# Patient Record
Sex: Male | Born: 1955 | Race: White | Hispanic: No | Smoking: Current every day smoker
Health system: Southern US, Community
[De-identification: ages and names within clinical notes are randomized; demographics above are authoritative.]

## PROBLEM LIST (undated history)

## (undated) DIAGNOSIS — I639 Cerebral infarction, unspecified: Secondary | ICD-10-CM

## (undated) DIAGNOSIS — J189 Pneumonia, unspecified organism: Secondary | ICD-10-CM

## (undated) DIAGNOSIS — G8929 Other chronic pain: Secondary | ICD-10-CM

## (undated) DIAGNOSIS — F329 Major depressive disorder, single episode, unspecified: Secondary | ICD-10-CM

## (undated) DIAGNOSIS — M545 Low back pain, unspecified: Secondary | ICD-10-CM

## (undated) DIAGNOSIS — F419 Anxiety disorder, unspecified: Secondary | ICD-10-CM

## (undated) DIAGNOSIS — F32A Depression, unspecified: Secondary | ICD-10-CM

## (undated) DIAGNOSIS — R51 Headache: Secondary | ICD-10-CM

## (undated) DIAGNOSIS — S3992XA Unspecified injury of lower back, initial encounter: Secondary | ICD-10-CM

## (undated) DIAGNOSIS — C4490 Unspecified malignant neoplasm of skin, unspecified: Secondary | ICD-10-CM

## (undated) DIAGNOSIS — M1990 Unspecified osteoarthritis, unspecified site: Secondary | ICD-10-CM

## (undated) DIAGNOSIS — R519 Headache, unspecified: Secondary | ICD-10-CM

## (undated) HISTORY — PX: NASAL RECONSTRUCTION WITH SEPTAL REPAIR: SHX5665

## (undated) HISTORY — PX: SKIN CANCER EXCISION: SHX779

---

## 1998-09-15 ENCOUNTER — Emergency Department (HOSPITAL_COMMUNITY): Admission: EM | Admit: 1998-09-15 | Discharge: 1998-09-15 | Payer: Self-pay

## 1998-09-19 ENCOUNTER — Encounter: Payer: Self-pay | Admitting: Emergency Medicine

## 1998-09-19 ENCOUNTER — Emergency Department (HOSPITAL_COMMUNITY): Admission: EM | Admit: 1998-09-19 | Discharge: 1998-09-19 | Payer: Self-pay | Admitting: Emergency Medicine

## 2000-05-26 ENCOUNTER — Ambulatory Visit (HOSPITAL_BASED_OUTPATIENT_CLINIC_OR_DEPARTMENT_OTHER): Admission: RE | Admit: 2000-05-26 | Discharge: 2000-05-26 | Payer: Self-pay | Admitting: Plastic Surgery

## 2001-12-14 ENCOUNTER — Encounter: Payer: Self-pay | Admitting: Emergency Medicine

## 2001-12-14 ENCOUNTER — Inpatient Hospital Stay (HOSPITAL_COMMUNITY): Admission: EM | Admit: 2001-12-14 | Discharge: 2001-12-15 | Payer: Self-pay | Admitting: *Deleted

## 2001-12-21 ENCOUNTER — Encounter: Admission: RE | Admit: 2001-12-21 | Discharge: 2001-12-21 | Payer: Self-pay | Admitting: Family Medicine

## 2003-01-21 DIAGNOSIS — I639 Cerebral infarction, unspecified: Secondary | ICD-10-CM

## 2003-01-21 HISTORY — DX: Cerebral infarction, unspecified: I63.9

## 2004-11-21 ENCOUNTER — Ambulatory Visit: Payer: Self-pay | Admitting: Cardiology

## 2004-11-21 ENCOUNTER — Observation Stay (HOSPITAL_COMMUNITY): Admission: EM | Admit: 2004-11-21 | Discharge: 2004-11-22 | Payer: Self-pay | Admitting: Emergency Medicine

## 2004-11-26 ENCOUNTER — Ambulatory Visit: Payer: Self-pay

## 2004-12-04 ENCOUNTER — Ambulatory Visit: Payer: Self-pay | Admitting: Cardiology

## 2004-12-06 ENCOUNTER — Inpatient Hospital Stay (HOSPITAL_BASED_OUTPATIENT_CLINIC_OR_DEPARTMENT_OTHER): Admission: RE | Admit: 2004-12-06 | Discharge: 2004-12-06 | Payer: Self-pay | Admitting: Cardiology

## 2004-12-06 HISTORY — PX: CARDIAC CATHETERIZATION: SHX172

## 2004-12-18 ENCOUNTER — Ambulatory Visit: Payer: Self-pay | Admitting: Cardiology

## 2005-09-01 ENCOUNTER — Ambulatory Visit: Payer: Self-pay | Admitting: Family Medicine

## 2005-10-02 ENCOUNTER — Ambulatory Visit: Payer: Self-pay | Admitting: Family Medicine

## 2006-01-16 ENCOUNTER — Encounter: Admission: RE | Admit: 2006-01-16 | Discharge: 2006-01-16 | Payer: Self-pay | Admitting: Family Medicine

## 2006-01-16 ENCOUNTER — Ambulatory Visit: Payer: Self-pay | Admitting: Family Medicine

## 2007-05-18 ENCOUNTER — Ambulatory Visit: Payer: Self-pay | Admitting: Family Medicine

## 2007-05-18 ENCOUNTER — Encounter: Admission: RE | Admit: 2007-05-18 | Discharge: 2007-05-18 | Payer: Self-pay | Admitting: Family Medicine

## 2008-06-28 ENCOUNTER — Emergency Department (HOSPITAL_COMMUNITY): Admission: EM | Admit: 2008-06-28 | Discharge: 2008-06-28 | Payer: Self-pay | Admitting: Emergency Medicine

## 2009-07-04 ENCOUNTER — Emergency Department (HOSPITAL_COMMUNITY): Admission: EM | Admit: 2009-07-04 | Discharge: 2009-07-04 | Payer: Self-pay | Admitting: Emergency Medicine

## 2009-11-18 ENCOUNTER — Ambulatory Visit: Payer: Self-pay | Admitting: Cardiology

## 2009-11-18 ENCOUNTER — Observation Stay (HOSPITAL_COMMUNITY): Admission: EM | Admit: 2009-11-18 | Discharge: 2009-11-19 | Payer: Self-pay | Admitting: Emergency Medicine

## 2009-11-19 ENCOUNTER — Encounter (INDEPENDENT_AMBULATORY_CARE_PROVIDER_SITE_OTHER): Payer: Self-pay | Admitting: Internal Medicine

## 2010-04-03 LAB — HEMOGLOBIN A1C
Hgb A1c MFr Bld: 5.7 % — ABNORMAL HIGH (ref <5.7)
Mean Plasma Glucose: 117 mg/dL — ABNORMAL HIGH (ref <117)

## 2010-04-03 LAB — POCT CARDIAC MARKERS
CKMB, poc: <1 ng/mL — ABNORMAL LOW (ref 1.0–8.0)
Myoglobin, poc: 40.9 ng/mL (ref 12–200)
Troponin i, poc: <0.05 ng/mL (ref 0.00–0.09)

## 2010-04-03 LAB — POCT I-STAT, CHEM 8
BUN: 10 mg/dL (ref 6–23)
Chloride: 106 mEq/L (ref 96–112)
Creatinine, Ser: 0.8 mg/dL (ref 0.4–1.5)
Sodium: 139 mEq/L (ref 135–145)

## 2010-04-03 LAB — RAPID URINE DRUG SCREEN, HOSP PERFORMED: Barbiturates: NOT DETECTED

## 2010-04-03 LAB — LIPID PANEL
Cholesterol: 145 mg/dL (ref 0–200)
Triglycerides: 73 mg/dL (ref <150)

## 2010-04-03 LAB — CBC
HCT: 41.7 % (ref 39.0–52.0)
Platelets: 245 10*3/uL (ref 150–400)
RBC: 4.56 MIL/uL (ref 4.22–5.81)
RDW: 11.9 % (ref 11.5–15.5)
WBC: 8.1 10*3/uL (ref 4.0–10.5)

## 2010-04-03 LAB — DIFFERENTIAL
Basophils Absolute: 0 10*3/uL (ref 0.0–0.1)
Lymphocytes Relative: 19 % (ref 12–46)
Lymphs Abs: 1.6 10*3/uL (ref 0.7–4.0)
Neutro Abs: 5.4 10*3/uL (ref 1.7–7.7)

## 2010-04-03 LAB — CK TOTAL AND CKMB (NOT AT ARMC)
CK, MB: 0.7 ng/mL (ref 0.3–4.0)
Total CK: 71 U/L (ref 7–232)

## 2010-04-03 LAB — CARDIAC PANEL(CRET KIN+CKTOT+MB+TROPI)
CK, MB: 0.5 ng/mL (ref 0.3–4.0)
CK, MB: 0.6 ng/mL (ref 0.3–4.0)
Total CK: 61 U/L (ref 7–232)

## 2010-04-03 LAB — TROPONIN I: Troponin I: <0.01 ng/mL (ref 0.00–0.06)

## 2010-04-07 LAB — RAPID URINE DRUG SCREEN, HOSP PERFORMED
Benzodiazepines: NOT DETECTED
Cocaine: NOT DETECTED
Opiates: NOT DETECTED
Tetrahydrocannabinol: NOT DETECTED

## 2010-04-07 LAB — CBC
HCT: 43.1 % (ref 39.0–52.0)
MCV: 93.2 fL (ref 78.0–100.0)
Platelets: 272 10*3/uL (ref 150–400)
WBC: 7.1 10*3/uL (ref 4.0–10.5)

## 2010-04-07 LAB — DIFFERENTIAL
Basophils Absolute: 0 10*3/uL (ref 0.0–0.1)
Basophils Relative: 1 % (ref 0–1)
Eosinophils Absolute: 0 10*3/uL (ref 0.0–0.7)
Eosinophils Relative: 1 % (ref 0–5)

## 2010-04-07 LAB — POCT CARDIAC MARKERS: CKMB, poc: <1 ng/mL — ABNORMAL LOW (ref 1.0–8.0)

## 2010-04-07 LAB — GLUCOSE, CAPILLARY

## 2010-04-07 LAB — TROPONIN I: Troponin I: 0.03 ng/mL (ref 0.00–0.06)

## 2010-04-07 LAB — COMPREHENSIVE METABOLIC PANEL
ALT: 15 U/L (ref 0–53)
AST: 19 U/L (ref 0–37)
CO2: 27 mEq/L (ref 19–32)
Chloride: 104 mEq/L (ref 96–112)
Creatinine, Ser: 0.96 mg/dL (ref 0.4–1.5)
GFR calc Af Amer: >60 mL/min (ref >60)
GFR calc non Af Amer: >60 mL/min (ref >60)
Sodium: 138 mEq/L (ref 135–145)
Total Bilirubin: 0.3 mg/dL (ref 0.3–1.2)

## 2010-04-07 LAB — PROTIME-INR: Prothrombin Time: 12.2 seconds (ref 11.6–15.2)

## 2010-04-29 LAB — URINALYSIS, ROUTINE W REFLEX MICROSCOPIC
Bilirubin Urine: NEGATIVE
Glucose, UA: NEGATIVE mg/dL
Ketones, ur: NEGATIVE mg/dL
Nitrite: NEGATIVE
Protein, ur: NEGATIVE mg/dL
pH: 6 (ref 5.0–8.0)

## 2010-04-29 LAB — DIFFERENTIAL
Basophils Relative: 1 % (ref 0–1)
Eosinophils Absolute: 0.1 10*3/uL (ref 0.0–0.7)
Eosinophils Relative: 1 % (ref 0–5)
Monocytes Absolute: 0.7 10*3/uL (ref 0.1–1.0)
Monocytes Relative: 9 % (ref 3–12)

## 2010-04-29 LAB — CBC
Platelets: 278 10*3/uL (ref 150–400)
RDW: 12.1 % (ref 11.5–15.5)
WBC: 7.3 10*3/uL (ref 4.0–10.5)

## 2010-04-29 LAB — RAPID URINE DRUG SCREEN, HOSP PERFORMED
Benzodiazepines: NOT DETECTED
Cocaine: NOT DETECTED
Tetrahydrocannabinol: POSITIVE — AB

## 2010-04-29 LAB — COMPREHENSIVE METABOLIC PANEL
ALT: 13 U/L (ref 0–53)
AST: 18 U/L (ref 0–37)
Albumin: 4.2 g/dL (ref 3.5–5.2)
Alkaline Phosphatase: 57 U/L (ref 39–117)
Potassium: 3.8 mEq/L (ref 3.5–5.1)
Sodium: 138 mEq/L (ref 135–145)
Total Protein: 7.1 g/dL (ref 6.0–8.3)

## 2010-04-29 LAB — ETHANOL: Alcohol, Ethyl (B): <5 mg/dL (ref 0–10)

## 2010-06-07 NOTE — Cardiovascular Report (Signed)
NAME:  Ruben Schmidt, KUCHER NO.:  [id number]   MEDICAL RECORD NO.:  [id number]          PATIENT TYPE:  OIB   LOCATION:  1961                         FACILITY:  [facility]   PHYSICIAN:  Charlies Constable, M.D. 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  He has hypertension and a history of remote smoking.  He was hospitalized with chest pain and subsequently had a Myoview scan  which showed positive chest pain and positive ECG changes but no abnormality  on the Myoview scan.  It was felt that he might have microvascular angina.  Because of persistent symptoms, he was brought in for evaluation with  catheterization.   PROCEDURE:  The procedure was performed via the right femoral artery using  arterial sheath and 4 French preformed coronary catheters.  A femoral  arterial puncture was performed and Omnipaque contrast was used.  The  patient tolerated the procedure well and left the laboratory in satisfactory  condition.   RESULTS:  Left main coronary artery was free of significant disease.   Left anterior descending artery gave rise to a diagonal branch and three  septal perforators.  These and the LAD proper were free of significant  disease.   Circumflex artery gave rise to a ramus branch, an atrial branch, a marginal  branch, and two posterolateral branches.  These vessels were free of  significant disease.   The right coronary artery is a moderately large vessel and gave rise to a  clonus branch, a right ventricular branch, and a a posterior descending  branch.  These vessels were free of significant disease.   The left ventriculogram performed in the RAO projection showed good wall  motion with no areas of hypokinesis.  The estimated ejection fraction was  60%.   HEMODYNAMIC DATA:  The aortic pressure was 110/73 with a mean of 91.  The  left ventricular pressure is 110/13.   CONCLUSION:  Normal coronary angiography and left ventricular wall motion.   RECOMMENDATIONS:  The etiology of the patient's chest pain is not clear.  Since he did have pain with exertion and ST segment with exertion, he may  have microvascular angina.  We will plan follow up with Dr. Daleen Squibb who will  make a decision regarding further therapy.           ______________________________  Charlies Constable, M.D. [facility]     BB/MEDQ  D:  12/06/2004  T:  12/06/2004  Job:  [id number]   cc:   Thomas C. Wall, M.D.  [address]  [address]  [place]  [place] [address]   Sherin Quarry, MD

## 2010-06-07 NOTE — Discharge Summary (Signed)
NAME:  CECILIO, OHLRICH NO.:  [id number]   MEDICAL RECORD NO.:  [id number]                   PATIENT TYPE:  INP   LOCATION:  5002                                 FACILITY:  [facility]   PHYSICIAN:  Leighton Roach McDiarmid, M.D.             DATE OF BIRTH:  10/24/1955   DATE OF ADMISSION:  12/14/2001  DATE OF DISCHARGE:  12/15/2001                                 DISCHARGE SUMMARY   DISCHARGE DIAGNOSES:  1. Viral upper respiratory infection.  2. Gastroesophageal reflux disease.   PROCEDURES:  None.   CONSULTATIONS:  None.   LABORATORY DATA:  1. Portable chest x-ray showed mild pulmonary vascular congestion without     edema.  2. CBC showed a white blood cell count of 7.6, hemoglobin 13.1, hematocrit     36.8, platelet count 253.  Differential showed 87% neutrophils, 6%     lymphocytes, 7% monocytes.  3. A complete metabolic panel showed a sodium of 135, potassium 3.5.     chloride 107, CO2 of 23, glucose 100, BUN 12, creatinine 0.9, total     bilirubin 0.6, alkaline phosphatase 63, SGOT 16, SGPT 26, total protein     6.5, albumin 3.8, calcium 8.7.  4. D-dimer was normal at less than 0.22.  5. Cardiac enzymes x 2 were checked and were both negative.  6. Urinalysis was completely normal.  7. EKG showed sinus tachycardia with a heart rate of 102.  There was     nonspecific T wave abnormality, inverted T waves in III and aVF.  T wave     was flat in V1 as well as V5 and V6.  No acute ischemic changes were     seen.   HOSPITAL COURSE:  1. VIRAL UPPER RESPIRATORY INFECTION:  This is a 56 year old white male who     presented to [facility] Emergency Department via EMS after having been     woken up with substernal chest pain rated at 5/5.  The pain was constant.     It radiated down his left arm causing some numbness.  It was unrelated to     activity.  However, he was also having some symptoms consistent with an     upper respiratory infection including  chills, fever, muscle aches,     nausea, and joint aches.  He also had sick contacts at home with three     children with the flu.  He also described some shortness of breath and     some dry heaving.  He was, therefore, admitted to further work up his     chest pain.  He was ruled out for myocardial infarction.  During his     observation, his symptoms improved on their own.  At discharge, he had no     further complaints of chest pain.  He had no shortness of breath.  His     vital signs  remained stable throughout his admission.  At discharge, his     temperature was 97.7, pulse 108, respiratory rate 20, blood pressure     122/63.  His oxygen saturation was 97% on room air.  He tolerated p.o.     without difficulty.  He was, therefore, sent home with symptomatic care     using Tylenol and Motrin as needed.   1. GASTROESOPHAGEAL REFLUX DISEASE:  The patient was given Pepcid p.r.n. for     pain.  He may continue this as an outpatient.   DISCHARGE MEDICATIONS:  1. Tylenol 650 mg p.o. q.6h. p.r.n. for pain or fever.  2. Ibuprofen 600 mg p.o. q.6h. p.r.n. for pain or fever.  3. Over-the-counter Zantac as needed for reflux disease.   ACTIVITY:  No restrictions.   DIET:  No restrictions.   WOUND CARE:  Not applicable.   SPECIAL INSTRUCTIONS:  The patient is to come back to the hospital for  increasing shortness of breath, chest pain, nausea, vomiting, or diarrhea.   FOLLOW UP:  The patient may make an appointment with the [facility].  Otherwise, he may also choose another primary care  physician in his insurance network.     Emmit Alexanders, M.D.                            Etta Grandchild, M.D.    DV/MEDQ  D:  12/15/2001  T:  12/16/2001  Job:  [id number]

## 2010-06-07 NOTE — H&P (Signed)
NAME:  IVERSON, SEES NO.:  [id number]   MEDICAL RECORD NO.:  [id number]          PATIENT TYPE:  INP   LOCATION:  3707                         FACILITY:  [facility]   PHYSICIAN:  Sherin Quarry, MD      DATE OF BIRTH:  1955/02/23   DATE OF ADMISSION:  11/21/2004  DATE OF DISCHARGE:                                HISTORY & PHYSICAL   Ruben Schmidt Schmidt is a pleasant 55 year old man who reports that today he came  to work at about 6 o'clock in the morning. 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 He became  diaphoretic. He estimates that the sweating lasted for about 10 minutes. He  felt dyspneic during this time. He also noticed a sense of sudden numbness  in his left arm which seemed to radiate down to his hand. These symptoms  caused his coworkers to become concerned about him and EMS was summoned.  According to the EMS report, they arrived to Ruben Schmidt him about 8:45 and he was  administered a sublingual nitroglycerin. He states that the nitroglycerin  made him feel dizzy and gave him a headache. It really did not have any  impact on the chest symptoms. On arrival to the emergency room, possibly as  result of the nitroglycerin, his blood pressure was 100/60, pulse was 92,  respirations were 18 and regular. Electrocardiogram was obtained which  showed a normal sinus rhythm with no ischemic changes. Serial point of care  cardiac markers have been obtained and are negative.   Mr. Staton reports that he does not smoke. He gave this up in 2000. He does  not have any history of diabetes or hypertension. As far as he knows, there  is no family history of heart disease. He does not know whether his  cholesterol is abnormal. In light of these symptoms, it was felt prudent to  admit Mr.  Schmidt for observation and to rule out myocardial infarction.   MEDICATIONS:  None.   ALLERGIES:  None.   OPERATIONS:  He has had skin cancer excised from his left temple.   MEDICAL ILLNESSES:  1.  The patient recalls that he was hospitalized in 2003 after presenting      with what sounds like rather similar symptoms. A pain in the chest that      radiated down his left arm. These symptoms were ultimately attributed to      a viral upper respiratory infection. He did not at that time or      subsequently have a stress test. He does not recall having had any      similar episodes of chest pain between 2003 and the present.  2.  His wife reports he had a history of kidney stone in the past, although      he did not have a procedure done for this.  3.  The patient has a longstanding  history of epigastric pain with meals. He      will take a Zantac p.r.n. for this. This does not wake him up at night.      He has had no hematemesis or melena.   FAMILY HISTORY:  His mother has a history of breast cancer. He does not  think the either his mother or father has a history of high blood pressure,  heart disease or diabetes. He has an uncle who has diabetes. He has a  brother but does not really know anything about his health history.   SOCIAL HISTORY:  He states that he does not abuse alcohol, does not abuse  drugs. He does not smoke, having quit smoking in 2000.   REVIEW OF SYSTEMS:  HEAD:  He is currently experiencing a headache as result  of nitroglycerin but denies past history of headache, dizziness or visual  blurring. EAR, NOSE AND THROAT:  Denies earache, sinus pain or sore throat.  CHEST:  Denies coughing wheezing or chest congestion. CARDIOVASCULAR:  Ruben Schmidt  above. GI: Ruben Schmidt above. GU: Denies dysuria or urinary frequency. NEURO:  There  is no history of seizure or stroke. ENDOCRINE:  Denies excessive thirst,  urinary frequency or nocturia.   PHYSICAL EXAMINATION:  VITAL SIGNS:   Temperature is 98.6, blood pressure is  103/71, pulse 83, respirations 18, O2 saturation 98%.  HEENT: Exam is within normal limits.  CHEST:  Clear.  CARDIOVASCULAR:  Reveals normal S1-S2 without rubs, murmurs or gallops.  ABDOMEN:  Benign. Normal bowel sounds without masses or tenderness. There is  no guarding or rebound.  NEUROLOGIC:  Testing is within normal limits.  EXTREMITIES:  Examination extremities reveals excellent DP and PT pulses.  There is no cyanosis or edema.   IMPRESSION:  1.  Exertional chest pain with associated arm numbness, diaphoresis,      squeezing in quality as well as shortness of breath. Need to consider a      possible cardiac etiology of these symptoms.  2.  Chronic gastroesophageal reflux/gastritis.  3.  History of hospitalization for a viral illness in November 2003. Note      that this episode was described as chest pain.  4.  History of kidney stone.   PLAN:  Rule out MI following standard protocol. We will place him on  Lovenox, aspirin and beta blocker. Cardiology consult was requested with  [facility] for possible Cardiolite stress testing. We are going to  place him empirically on Protonix in light of his acid reflux symptoms.           ______________________________  Sherin Quarry, MD     SY/MEDQ  D:  11/21/2004  T:  11/22/2004  Job:  [id number]   cc:   Talmadge Coventry, M.D.  Fax: [phone]

## 2010-06-07 NOTE — Discharge Summary (Signed)
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  These have boxes weighed about four pounds each, he  said.  After doing this activity, he began to experience a sharp discomfort  in the left chest area associated with an intense squeezing sensation.  He  became diaphoretic and reports that these symptoms persisted for several  minutes.  He noted some shortness of breath and his coworkers became  concerned that he looked sick.  He also complained of numbness in his left  arm which seemed to radiate to the hand.  Although he did not want them to  do so, his coworkers called EMS and on arrival he was given one sublingual  nitroglycerin, which really did not have any effect on his symptoms.  On  arrival to the emergency room his blood pressure was 100/60, probably as  result of the nitroglycerin.  His electrocardiogram showed a normal sinus  rhythm.  His physical exam was unremarkable.  His chest was clear.  Cardiovascular exam revealed normal S1-S2 without rubs, murmurs or gallops.  Laboratory studies obtained included a normal CBC.  Serial point of care  cardiac markers were negative.  BMET was normal.  Subsequently troponin  levels have been 0.01.  A lipid profile was obtained, which showed a total  cholesterol of 224, triglycerides of 297, HDL of 38, LDL of 127.  The  patient was monitored on telemetry and remained in a sinus rhythm.  He was  seen in consultation by Dr. [place] Bing, who felt that his  numbness  symptoms might possibly be secondary to a cervical radiculopathy and that  his chest pain was atypical but that he did need to have a stress test.  He  felt that the stress test could safely be performed as an outpatient.  On  November 3 the patient was feeling fine.  Vital signs were stable.  He was  having no further chest pain or breathing difficulty.  It was therefore felt  reasonable to discharge him at that time.   DISCHARGE DIAGNOSES:  1.  Atypical chest pain, consider possible angina.  2.  Chronic epigastric pain probably secondary to gastritis or reflux      esophagitis.  3.  History of kidney stone.  4.  Transient left arm numbness, could be secondary to cervical      radiculopathy. `   DISCHARGE INSTRUCTIONS:  On discharge the patient was instructed to report  to [facility] cardiology on November 7 at 9:45 for a stress Myoview study.  In  the meantime he  was advised to absolutely refrain from any heavy lifting.  He was advised to take aspirin 325 mg daily and Protonix 40 mg daily.  I did  not initiate any treatment for his cholesterol as the patient was not very  clear about whether he would see anyone on a regular basis in follow-up.  I  had a long conversation with him in which I urged him to begin to see a  primary care doctor on a regular basis.  His wife sees Dr. Smith Mince, and I  suggested that her office would be a good one to establish primary  care at.  For this reason I am sending a copy of this dictation to Dr.  Stephannie Peters office.  Hopefully, he will follow through with this advice and  she can address his mildly abnormal cholesterol level.  [name] was  advised not to return to his job until the stress test was performed because  his job involves significant heavy lifting.           ______________________________  Sherin Quarry, MD     SY/MEDQ  D:  11/22/2004  T:  11/22/2004  Job:  [id number]   cc:   Severn Bing, M.D. [facility]  [address]   [address]  [place]  [place] [address]   Talmadge Coventry, M.D.  Fax: [phone]

## 2010-06-07 NOTE — Consult Note (Signed)
NAME:  Ruben Schmidt, Ruben Schmidt NO.:  [id number]   MEDICAL RECORD NO.:  [id number]          PATIENT TYPE:  INP   LOCATION:  3707                         FACILITY:  [facility]   PHYSICIAN:  [place] Bing, M.D. [facility] OF BIRTH:  09-29-1955   DATE OF CONSULTATION:  11/21/2004  DATE OF DISCHARGE:                                   CONSULTATION   REFERRING PHYSICIAN:  Dr. Tresa Endo.   HISTORY OF PRESENT ILLNESS:  A 55 year old gentleman presenting to the  emergency department with acute onset of chest discomfort. Ruben Schmidt has  no known cardiovascular history. He has had borderline hypertension that has  not required medical therapy. He has no diabetes. He has a history of remote  cigarette smoking. His lipid status is unknown. While lifting boxes at work  this morning, he had the sudden onset of sharp and moderately severe left  chest pain with radiation to the left shoulder and left arm and predominant  numbness in the left arm. There is no dyspnea, diaphoresis nor nausea. The  chest discomfort gradually faded, but numbness has persisted and extending  to the fifth finger.   Past medical history is notable for excision of lipoma from the patient's  back in 2003. He underwent remote sinus surgery. He has had GERD symptoms.  He was admitted to hospital in 2003 with chest discomfort and found to have  a URI.   He uses no medications routinely. He has no known allergies.   SOCIAL HISTORY:  Married and lives with his wife in [place]; works for  [employer] and [employer]. A 60-pack-year history of cigarette smoking,  discontinued 7 years ago. He does have moderate chronic consumption of  alcohol.   FAMILY HISTORY:  No first-degree relatives with coronary or vascular  disease. No diabetes or hypertension.   Review of systems is notable for the need for corrective lenses for reading,  suboptimal regular dental care and constant heartburn. He underwent excision  of skin cancer from  his right forehead in 2003. Other systems reviewed and  are negative.   EXAM:  GENERAL:  Pleasant, well-appearing gentleman.  VITAL SIGNS:  Temperature is 98.2, heart rate 90 and regular, respirations  18, blood pressure 100/60, O2 saturation 97% on 2 liters.  HEENT: Anicteric sclerae; normal lids and conjunctiva.  NECK: No jugular venous distension; normal carotid upstrokes without bruits.  ENDOCRINE: No thyromegaly.  HEMATOPOIETIC: No adenopathy.  SKIN: No significant lesions.  LUNGS: Clear.  CARDIAC: Normal first and second heart sounds; fourth heart sound present.  ABDOMEN: Soft and nontender; no organomegaly.  EXTREMITIES: No edema; distal pulses intact type.  NEUROLOGIC: Decreased pinprick sensation in the left hand over the C8  distribution.   Chest x-ray:  Apical bolus change; no acute abnormalities.  EKG: Normal sinus rhythm; minimal nonspecific ST-segment abnormalities,  particularly inferiorly.   IMPRESSION:  Ruben Schmidt symptoms are most suggestive of the cervical  radiculopathy. C-spine films have been requested. Due to the somewhat  atypical and fairly prominent component of chest discomfort, I agree with an  observation stay to rule out myocardial infarction. If tests remain  negative, as expected, we will arrange for an outpatient stress test. The  request for consultation is greatly appreciated.      [place] Bing, M.D. [facility]  Electronically Signed     RR/MEDQ  D:  11/21/2004  T:  11/22/2004  Job:  [id number]

## 2011-06-10 ENCOUNTER — Emergency Department (HOSPITAL_COMMUNITY): Payer: Medicare Other

## 2011-06-10 ENCOUNTER — Encounter (HOSPITAL_COMMUNITY): Payer: Self-pay

## 2011-06-10 ENCOUNTER — Emergency Department (HOSPITAL_COMMUNITY)
Admission: EM | Admit: 2011-06-10 | Discharge: 2011-06-10 | Disposition: A | Discharge status: Home / Self Care | Payer: Medicare Other | Attending: Emergency Medicine | Admitting: Emergency Medicine

## 2011-06-10 DIAGNOSIS — M545 Low back pain, unspecified: Secondary | ICD-10-CM | POA: Insufficient documentation

## 2011-06-10 DIAGNOSIS — M25551 Pain in right hip: Secondary | ICD-10-CM

## 2011-06-10 DIAGNOSIS — M25559 Pain in unspecified hip: Secondary | ICD-10-CM | POA: Insufficient documentation

## 2011-06-10 DIAGNOSIS — J45909 Unspecified asthma, uncomplicated: Secondary | ICD-10-CM | POA: Insufficient documentation

## 2011-06-10 DIAGNOSIS — F17200 Nicotine dependence, unspecified, uncomplicated: Secondary | ICD-10-CM | POA: Insufficient documentation

## 2011-06-10 HISTORY — DX: Unspecified osteoarthritis, unspecified site: M19.90

## 2011-06-10 HISTORY — DX: Cerebral infarction, unspecified: I63.9

## 2011-06-10 MED ORDER — HYDROCODONE-ACETAMINOPHEN 5-325 MG PO TABS: ORAL_TABLET | ORAL | Status: DC

## 2011-06-10 MED ORDER — HYDROCODONE-ACETAMINOPHEN 5-325 MG PO TABS
1.0000 | ORAL_TABLET | Freq: Once | ORAL | Status: AC
  Administered 2011-06-10: 1 via ORAL
  Filled 2011-06-10: qty 1

## 2011-06-10 MED ORDER — IBUPROFEN 800 MG PO TABS
800.0000 mg | ORAL_TABLET | Freq: Once | ORAL | Status: AC
  Administered 2011-06-10: 800 mg via ORAL
  Filled 2011-06-10: qty 1
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
## 2011-06-10 NOTE — ED Notes (Signed)
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
## 2011-06-10 NOTE — Discharge Instructions (Signed)
Cryotherapy Cryotherapy means treatment with cold. Ice or gel packs can be used to reduce both pain and swelling. Ice is the most helpful within the first 24 to 48 hours after an injury or flareup from overusing a muscle or joint. Sprains, strains, spasms, burning pain, shooting pain, and aches can all be eased with ice. Ice can also be used when recovering from surgery. Ice is effective, has very few side effects, and is safe for most people to use. PRECAUTIONS  Ice is not a safe treatment option for people with:  Raynaud's phenomenon. This is a condition affecting small blood vessels in the extremities. Exposure to cold may cause your problems to return.   Cold hypersensitivity. There are many forms of cold hypersensitivity, including:   Cold urticaria. Red, itchy hives appear on the skin when the tissues begin to warm after being iced.   Cold erythema. This is a red, itchy rash caused by exposure to cold.   Cold hemoglobinuria. Red blood cells break down when the tissues begin to warm after being iced. The hemoglobin that carry oxygen are passed into the urine because they cannot combine with blood proteins fast enough.   Numbness or altered sensitivity in the area being iced.  If you have any of the following conditions, do not use ice until you have discussed cryotherapy with your caregiver:  Heart conditions, such as arrhythmia, angina, or chronic heart disease.   High blood pressure.   Healing wounds or open skin in the area being iced.   Current infections.   Rheumatoid arthritis.   Poor circulation.   Diabetes.  Ice slows the blood flow in the region it is applied. This is beneficial when trying to stop inflamed tissues from spreading irritating chemicals to surrounding tissues. However, if you expose your skin to cold temperatures for too long or without the proper protection, you can damage your skin or nerves. Watch for signs of skin damage due to cold. HOME CARE  INSTRUCTIONS Follow these tips to use ice and cold packs safely.  Place a dry or damp towel between the ice and skin. A damp towel will cool the skin more quickly, so you may need to shorten the time that the ice is used.   For a more rapid response, add gentle compression to the ice.   Ice for no more than 10 to 20 minutes at a time. The bonier the area you are icing, the less time it will take to get the benefits of ice.   Check your skin after 5 minutes to make sure there are no signs of a poor response to cold or skin damage.   Rest 20 minutes or more in between uses.   Once your skin is numb, you can end your treatment. You can test numbness by very lightly touching your skin. The touch should be so light that you do not see the skin dimple from the pressure of your fingertip. When using ice, most people will feel these normal sensations in this order: cold, burning, aching, and numbness.   Do not use ice on someone who cannot communicate their responses to pain, such as small children or people with dementia.  HOW TO MAKE AN ICE PACK Ice packs are the most common way to use ice therapy. Other methods include ice massage, ice baths, and cryo-sprays. Muscle creams that cause a cold, tingly feeling do not offer the same benefits that ice offers and should not be used as a substitute  unless recommended by your caregiver. To make an ice pack, do one of the following:  Place crushed ice or a bag of frozen vegetables in a sealable plastic bag. Squeeze out the excess air. Place this bag inside another plastic bag. Slide the bag into a pillowcase or place a damp towel between your skin and the bag.   Mix 3 parts water with 1 part rubbing alcohol. Freeze the mixture in a sealable plastic bag. When you remove the mixture from the freezer, it will be slushy. Squeeze out the excess air. Place this bag inside another plastic bag. Slide the bag into a pillowcase or place a damp towel between your skin  and the bag.  SEEK MEDICAL CARE IF:  You develop white spots on your skin. This may give the skin a blotchy (mottled) appearance.   Your skin turns blue or pale.   Your skin becomes waxy or hard.   Your swelling gets worse.  MAKE SURE YOU:   Understand these instructions.   Will watch your condition.   Will get help right away if you are not doing well or get worse.  Document Released: 09/02/2010 Document Revised: 12/26/2010 Document Reviewed: 09/02/2010 [facility] Patient Information 2012 [place], [place].Back Pain, Adult Low back pain is very common. About 1 in 5 people have back pain.The cause of low back pain is rarely dangerous. The pain often gets better over time.About half of people with a sudden onset of back pain feel better in just 2 weeks. About 8 in 10 people feel better by 6 weeks.  CAUSES Some common causes of back pain include:  Strain of the muscles or ligaments supporting the spine.   Wear and tear (degeneration) of the spinal discs.   Arthritis.   Direct injury to the back.  DIAGNOSIS Most of the time, the direct cause of low back pain is not known.However, back pain can be treated effectively even when the exact cause of the pain is unknown.Answering your caregiver's questions about your overall health and symptoms is one of the most accurate ways to make sure the cause of your pain is not dangerous. If your caregiver needs more information, he or she may order lab work or imaging tests (X-rays or MRIs).However, even if imaging tests show changes in your back, this usually does not require surgery. HOME CARE INSTRUCTIONS For many people, back pain returns.Since low back pain is rarely dangerous, it is often a condition that people can learn to [facility] their own.   Remain active. It is stressful on the back to sit or stand in one place. Do not sit, drive, or stand in one place for more than 30 minutes at a time. Take short walks on level surfaces as soon as  pain allows.Try to increase the length of time you walk each day.   Do not stay in bed.Resting more than 1 or 2 days can delay your recovery.   Do not avoid exercise or work.Your body is made to move.It is not dangerous to be active, even though your back may hurt.Your back will likely heal faster if you return to being active before your pain is gone.   Pay attention to your body when you bend and lift. Many people have less discomfortwhen lifting if they bend their knees, keep the load close to their bodies,and avoid twisting. Often, the most comfortable positions are those that put less stress on your recovering back.   Find a comfortable position to sleep. Use a firm  mattress and lie on your side with your knees slightly bent. If you lie on your back, put a pillow under your knees.   Only take over-the-counter or prescription medicines as directed by your caregiver. Over-the-counter medicines to reduce pain and inflammation are often the most helpful.Your caregiver may prescribe muscle relaxant drugs.These medicines help dull your pain so you can more quickly return to your normal activities and healthy exercise.   Put ice on the injured area.   Put ice in a plastic bag.   Place a towel between your skin and the bag.   Leave the ice on for 15 to 20 minutes, 3 to 4 times a day for the first 2 to 3 days. After that, ice and heat may be alternated to reduce pain and spasms.   Ask your caregiver about trying back exercises and gentle massage. This may be of some benefit.   Avoid feeling anxious or stressed.Stress increases muscle tension and can worsen back pain.It is important to recognize when you are anxious or stressed and learn ways to manage it.Exercise is a great option.  SEEK MEDICAL CARE IF:  You have pain that is not relieved with rest or medicine.   You have pain that does not improve in 1 week.   You have new symptoms.   You are generally not feeling well.    SEEK IMMEDIATE MEDICAL CARE IF:   You have pain that radiates from your back into your legs.   You develop new bowel or bladder control problems.   You have unusual weakness or numbness in your arms or legs.   You develop nausea or vomiting.   You develop abdominal pain.   You feel faint.  Document Released: 01/06/2005 Document Revised: 12/26/2010 Document Reviewed: 05/27/2010 [facility] Patient Information 2012 [place], [place].   The x-rays of your lower back and right hip show no acute abnormalities.   Apply ice several times daily to the areas of soreness.  Take ibuprofen up to 800 mg every 8 hrs with food.  Take the pain medicine as directed.  Follow up with dr. Hilda Lias as needed.  Find a primary care MD.

## 2011-06-10 NOTE — ED Provider Notes (Signed)
Medical screening examination/treatment/procedure(s) were performed by non-physician practitioner and as supervising physician I was immediately available for consultation/collaboration.   Joya Gaskins, MD 06/10/11 [phone]

## 2013-07-20 DIAGNOSIS — S2239XA Fracture of one rib, unspecified side, initial encounter for closed fracture: Secondary | ICD-10-CM | POA: Insufficient documentation

## 2013-07-20 DIAGNOSIS — J939 Pneumothorax, unspecified: Secondary | ICD-10-CM | POA: Insufficient documentation

## 2014-05-04 ENCOUNTER — Encounter (HOSPITAL_COMMUNITY): Payer: Self-pay | Admitting: *Deleted

## 2014-05-04 ENCOUNTER — Emergency Department (HOSPITAL_COMMUNITY): Payer: Medicare HMO

## 2014-05-04 ENCOUNTER — Emergency Department (HOSPITAL_COMMUNITY)
Admission: EM | Admit: 2014-05-04 | Discharge: 2014-05-04 | Disposition: A | Discharge status: Home / Self Care | Payer: Medicare HMO | Attending: Emergency Medicine | Admitting: Emergency Medicine

## 2014-05-04 DIAGNOSIS — G8929 Other chronic pain: Secondary | ICD-10-CM | POA: Insufficient documentation

## 2014-05-04 DIAGNOSIS — M1990 Unspecified osteoarthritis, unspecified site: Secondary | ICD-10-CM | POA: Diagnosis not present

## 2014-05-04 DIAGNOSIS — M545 Low back pain: Secondary | ICD-10-CM | POA: Insufficient documentation

## 2014-05-04 DIAGNOSIS — J45909 Unspecified asthma, uncomplicated: Secondary | ICD-10-CM | POA: Insufficient documentation

## 2014-05-04 DIAGNOSIS — Z720 Tobacco use: Secondary | ICD-10-CM | POA: Insufficient documentation

## 2014-05-04 DIAGNOSIS — Z8673 Personal history of transient ischemic attack (TIA), and cerebral infarction without residual deficits: Secondary | ICD-10-CM | POA: Diagnosis not present

## 2014-05-04 DIAGNOSIS — R200 Anesthesia of skin: Secondary | ICD-10-CM | POA: Diagnosis not present

## 2014-05-04 DIAGNOSIS — Z9889 Other specified postprocedural states: Secondary | ICD-10-CM | POA: Diagnosis not present

## 2014-05-04 HISTORY — DX: Unspecified injury of lower back, initial encounter: S39.92XA

## 2014-05-04 LAB — URINALYSIS, ROUTINE W REFLEX MICROSCOPIC
Bilirubin Urine: NEGATIVE
GLUCOSE, UA: NEGATIVE mg/dL
HGB URINE DIPSTICK: NEGATIVE
KETONES UR: NEGATIVE mg/dL
Leukocytes, UA: NEGATIVE
Nitrite: NEGATIVE
PH: 5.5 (ref 5.0–8.0)
PROTEIN: NEGATIVE mg/dL
Specific Gravity, Urine: 1.02 (ref 1.005–1.030)
Urobilinogen, UA: 0.2 mg/dL (ref 0.0–1.0)

## 2014-05-04 MED ORDER — IBUPROFEN 800 MG PO TABS: 800.0000 mg | ORAL_TABLET | Freq: Three times a day (TID) | ORAL | Status: DC | PRN

## 2014-05-04 MED ORDER — OXYCODONE-ACETAMINOPHEN 5-325 MG PO TABS
2.0000 | ORAL_TABLET | Freq: Once | ORAL | Status: AC
  Administered 2014-05-04: 2 via ORAL
  Filled 2014-05-04: qty 2

## 2014-05-04 MED ORDER — OXYCODONE-ACETAMINOPHEN 5-325 MG PO TABS: 1.0000 | ORAL_TABLET | Freq: Four times a day (QID) | ORAL | Status: DC | PRN
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
## 2014-05-04 NOTE — ED Notes (Signed)
Pt stood up to get out of w/c and cried out and went to floor. Says he had a bad pain and fell.  Helped to bed with assistance. MAE, alert No HI

## 2014-05-04 NOTE — Discharge Instructions (Signed)
Back Pain, Adult Low back pain is very common. About 1 in 5 people have back pain.The cause of low back pain is rarely dangerous. The pain often gets better over time.About half of people with a sudden onset of back pain feel better in just 2 weeks. About 8 in 10 people feel better by 6 weeks.  CAUSES Some common causes of back pain include:  Strain of the muscles or ligaments supporting the spine.  Wear and tear (degeneration) of the spinal discs.  Arthritis.  Direct injury to the back. DIAGNOSIS Most of the time, the direct cause of low back pain is not known.However, back pain can be treated effectively even when the exact cause of the pain is unknown.Answering your caregiver's questions about your overall health and symptoms is one of the most accurate ways to make sure the cause of your pain is not dangerous. If your caregiver needs more information, he or she may order lab work or imaging tests (X-rays or MRIs).However, even if imaging tests show changes in your back, this usually does not require surgery. HOME CARE INSTRUCTIONS For many people, back pain returns.Since low back pain is rarely dangerous, it is often a condition that people can learn to manageon their own.   Remain active. It is stressful on the back to sit or stand in one place. Do not sit, drive, or stand in one place for more than 30 minutes at a time. Take short walks on level surfaces as soon as pain allows.Try to increase the length of time you walk each day.  Do not stay in bed.Resting more than 1 or 2 days can delay your recovery.  Do not avoid exercise or work.Your body is made to move.It is not dangerous to be active, even though your back may hurt.Your back will likely heal faster if you return to being active before your pain is gone.  Pay attention to your body when you bend and lift. Many people have less discomfortwhen lifting if they bend their knees, keep the load close to their bodies,and  avoid twisting. Often, the most comfortable positions are those that put less stress on your recovering back.  Find a comfortable position to sleep. Use a firm mattress and lie on your side with your knees slightly bent. If you lie on your back, put a pillow under your knees.  Only take over-the-counter or prescription medicines as directed by your caregiver. Over-the-counter medicines to reduce pain and inflammation are often the most helpful.Your caregiver may prescribe muscle relaxant drugs.These medicines help dull your pain so you can more quickly return to your normal activities and healthy exercise.  Put ice on the injured area.  Put ice in a plastic bag.  Place a towel between your skin and the bag.  Leave the ice on for 15-20 minutes, 03-04 times a day for the first 2 to 3 days. After that, ice and heat may be alternated to reduce pain and spasms.  Ask your caregiver about trying back exercises and gentle massage. This may be of some benefit.  Avoid feeling anxious or stressed.Stress increases muscle tension and can worsen back pain.It is important to recognize when you are anxious or stressed and learn ways to manage it.Exercise is a great option. SEEK MEDICAL CARE IF:  You have pain that is not relieved with rest or medicine.  You have pain that does not improve in 1 week.  You have new symptoms.  You are generally not feeling well. SEEK   IMMEDIATE MEDICAL CARE IF:   You have pain that radiates from your back into your legs.  You develop new bowel or bladder control problems.  You have unusual weakness or numbness in your arms or legs.  You develop nausea or vomiting.  You develop abdominal pain.  You feel faint. Document Released: 01/06/2005 Document Revised: 07/08/2011 Document Reviewed: 05/10/2013 [employer] Patient Information 2015 [employer]. This information is not intended to replace advice given to you by your health care provider. Make sure you  discuss any questions you have with your health care provider.  

## 2014-05-04 NOTE — ED Notes (Signed)
Low back pain  .  Dysuria,  No NVD,   No fever.  Has difficulty voiding for 2 days.

## 2014-05-04 NOTE — ED Notes (Signed)
Patient given discharge instruction, verbalized understand. Patient wheelchair out of the department.  

## 2015-05-16 ENCOUNTER — Other Ambulatory Visit: Payer: Self-pay | Admitting: Neurosurgery

## 2015-05-16 DIAGNOSIS — M5416 Radiculopathy, lumbar region: Secondary | ICD-10-CM

## 2015-05-23 ENCOUNTER — Ambulatory Visit
Admission: RE | Admit: 2015-05-23 | Discharge: 2015-05-23 | Disposition: A | Discharge status: Home / Self Care | Payer: Medicare HMO | Source: Ambulatory Visit | Attending: Neurosurgery | Admitting: Neurosurgery

## 2015-05-23 DIAGNOSIS — M5416 Radiculopathy, lumbar region: Secondary | ICD-10-CM

## 2015-05-23 MED ORDER — IOPAMIDOL (ISOVUE-M 200) INJECTION 41%
20.0000 mL | Freq: Once | INTRAMUSCULAR | Status: AC
  Administered 2015-05-23: 20 mL via INTRATHECAL

## 2015-05-23 MED ORDER — MEPERIDINE HCL 100 MG/ML IJ SOLN
50.0000 mg | Freq: Once | INTRAMUSCULAR | Status: AC
  Administered 2015-05-23: 50 mg via INTRAMUSCULAR

## 2015-05-23 MED ORDER — DIAZEPAM 5 MG PO TABS
5.0000 mg | ORAL_TABLET | Freq: Once | ORAL | Status: AC
  Administered 2015-05-23: 5 mg via ORAL

## 2015-05-23 MED ORDER — ONDANSETRON HCL 4 MG/2ML IJ SOLN
4.0000 mg | Freq: Once | INTRAMUSCULAR | Status: AC
  Administered 2015-05-23: 4 mg via INTRAMUSCULAR

## 2015-05-23 NOTE — Discharge Instructions (Signed)

## 2016-04-05 ENCOUNTER — Encounter (HOSPITAL_COMMUNITY): Payer: Self-pay | Admitting: *Deleted

## 2016-04-05 ENCOUNTER — Observation Stay (HOSPITAL_COMMUNITY)
Admission: EM | Admit: 2016-04-05 | Discharge: 2016-04-06 | Disposition: A | Discharge status: Home / Self Care | Payer: Medicare HMO | Attending: Internal Medicine | Admitting: Internal Medicine

## 2016-04-05 ENCOUNTER — Emergency Department (HOSPITAL_COMMUNITY): Payer: Medicare HMO

## 2016-04-05 DIAGNOSIS — Z79899 Other long term (current) drug therapy: Secondary | ICD-10-CM | POA: Insufficient documentation

## 2016-04-05 DIAGNOSIS — R079 Chest pain, unspecified: Principal | ICD-10-CM | POA: Insufficient documentation

## 2016-04-05 DIAGNOSIS — F17210 Nicotine dependence, cigarettes, uncomplicated: Secondary | ICD-10-CM | POA: Insufficient documentation

## 2016-04-05 DIAGNOSIS — J45909 Unspecified asthma, uncomplicated: Secondary | ICD-10-CM | POA: Insufficient documentation

## 2016-04-05 LAB — BASIC METABOLIC PANEL
Anion gap: 8 (ref 5–15)
BUN: 18 mg/dL (ref 6–20)
CO2: 25 mmol/L (ref 22–32)
CREATININE: 0.96 mg/dL (ref 0.61–1.24)
Calcium: 9.6 mg/dL (ref 8.9–10.3)
Chloride: 106 mmol/L (ref 101–111)
GFR calc Af Amer: >60 mL/min (ref >60)
Glucose, Bld: 100 mg/dL — ABNORMAL HIGH (ref 65–99)
Potassium: 3.7 mmol/L (ref 3.5–5.1)
SODIUM: 139 mmol/L (ref 135–145)

## 2016-04-05 LAB — I-STAT TROPONIN, ED
Troponin i, poc: 0 ng/mL (ref 0.00–0.08)
Troponin i, poc: 0.01 ng/mL (ref 0.00–0.08)

## 2016-04-05 LAB — CBC
HCT: 42.7 % (ref 39.0–52.0)
Hemoglobin: 14.9 g/dL (ref 13.0–17.0)
MCH: 32 pg (ref 26.0–34.0)
MCHC: 34.9 g/dL (ref 30.0–36.0)
MCV: 91.6 fL (ref 78.0–100.0)
PLATELETS: 322 10*3/uL (ref 150–400)
RBC: 4.66 MIL/uL (ref 4.22–5.81)
RDW: 12.7 % (ref 11.5–15.5)
WBC: 10.6 10*3/uL — ABNORMAL HIGH (ref 4.0–10.5)

## 2016-04-05 NOTE — ED Triage Notes (Signed)
Pt c/o left side chest pain with left arm and left leg numbness that started yesterday, pt reports that he does have problems walking due to left leg weakness, pt admits to taking 324mg  aspirin prior to calling ems tonight, did refuse nitro with ems,

## 2016-04-05 NOTE — ED Provider Notes (Signed)
[place] DEPT Provider Note   CSN: [id number] Arrival date & time: 04/05/16  2126     History   Chief Complaint Chief Complaint  Patient presents with  . Chest Pain    HPI Ruben Schmidt is a 61 y.o. male.  HPI Patient presents with central chest pain that radiates to the left side of the neck, shoulder and down left arm. Started yesterday. States the pain has been constant since yesterday. No shortness of breath. Has persistent cough which is unchanged. No fever. No new lower extremity swelling or pain. Denies history of previous coronary artery disease. States he smokes one to 2 packs for the past 40+ years. No known family history of coronary artery disease. Past Medical History:  Diagnosis Date  . Arthritis   . Asthma   . Back injury   . Stroke [facility])     Patient Active Problem List   Diagnosis Date Noted  . Chest pain 04/05/2016  . Pneumothorax 07/20/2013  . Broken rib 07/20/2013    Past Surgical History:  Procedure Laterality Date  . sinus cavity    . SKIN CANCER EXCISION         Home Medications    Prior to Admission medications   Medication Sig Start Date End Date Taking? Authorizing Provider  ibuprofen (ADVIL,MOTRIN) 200 MG tablet Take 600 mg by mouth daily.   Yes Historical Provider, MD  naproxen sodium (ANAPROX) 220 MG tablet Take 440 mg by mouth 3 (three) times daily as needed (for pain).   Yes Historical Provider, MD    Family History No family history on file.  Social History Social History  Substance Use Topics  . Smoking status: Current Every Day Smoker    Types: Cigarettes  . Smokeless tobacco: Never Used  . Alcohol use Yes     Comment: occ     Allergies   Fluvirin [flu virus vaccine]   Review of Systems Review of Systems  Constitutional: Positive for chills. Negative for fatigue and fever.  Respiratory: Positive for cough. Negative for shortness of breath and wheezing.   Cardiovascular: Positive for chest pain.  Negative for palpitations and leg swelling.  Gastrointestinal: Negative for abdominal pain, diarrhea, nausea and vomiting.  Genitourinary: Negative for dysuria, flank pain and frequency.  Musculoskeletal: Negative for back pain, myalgias, neck pain and neck stiffness.  Neurological: Negative for dizziness, weakness, light-headedness, numbness and headaches.  All other systems reviewed and are negative.    Physical Exam Updated Vital Signs BP 109/73   Pulse 81   Temp 98.5 F (36.9 C) (Oral)   Resp 13   Ht 5\' 8"  (1.727 m)   Wt 150 lb (68 kg)   SpO2 98%   BMI 22.81 kg/m   Physical Exam  Constitutional: He is oriented to person, place, and time. He appears well-developed and well-nourished. No distress.  HENT:  Head: Normocephalic and atraumatic.  Mouth/Throat: Oropharynx is clear and moist. No oropharyngeal exudate.  Eyes: EOM are normal. Pupils are equal, round, and reactive to light.  Neck: Normal range of motion. Neck supple. No JVD present.  Cardiovascular: Normal rate and regular rhythm.  Exam reveals no gallop and no friction rub.   No murmur heard. Pulmonary/Chest: Effort normal and breath sounds normal. No respiratory distress. He has no wheezes. He has no rales. He exhibits no tenderness.  Abdominal: Soft. Bowel sounds are normal. There is no tenderness. There is no rebound and no guarding.  Musculoskeletal: Normal range of motion. He exhibits  no edema or tenderness.  No lower extremity swelling, asymmetry or tenderness.  Neurological: He is alert and oriented to person, place, and time.  Moving all extremities without deficit. Sensation fully intact.  Skin: Skin is warm and dry. Capillary refill takes less than 2 seconds. No rash noted. No erythema.  Psychiatric: He has a normal mood and affect. His behavior is normal.  Nursing note and vitals reviewed.    ED Treatments / Results  Labs (all labs ordered are listed, but only abnormal results are displayed) Labs  Reviewed  BASIC METABOLIC PANEL - Abnormal; Notable for the following:       Result Value   Glucose, Bld 100 (*)    All other components within normal limits  CBC - Abnormal; Notable for the following:    WBC 10.6 (*)    All other components within normal limits  Randolm Idol, ED  Randolm Idol, ED    EKG  EKG Interpretation  Date/Time:  Saturday April 05 2016 21:39:33 EDT Ventricular Rate:  84 PR Interval:    QRS Duration: 74 QT Interval:  317 QTC Calculation: 375 R Axis:   86 Text Interpretation:  Sinus rhythm Anteroseptal infarct, age indeterminate Confirmed by Lita Mains  MD, Karolyn Messing ([id number]) on 04/05/2016 9:46:07 PM       Radiology Dg Chest 2 View  Result Date: 04/05/2016 CLINICAL DATA:  61 year old male with left-sided chest pain. EXAM: CHEST  2 VIEW COMPARISON:  Chest radiograph dated 11/18/2009 FINDINGS: There is mild emphysematous changes of the lungs. No focal consolidation, pleural effusion, or pneumothorax. Biapical pleural scarring noted. No acute osseous pathology. IMPRESSION: No active cardiopulmonary disease. Electronically Signed   By: Anner Crete M.D.   On: 04/05/2016 23:12    Procedures Procedures (including critical care time)  Medications Ordered in ED Medications - No data to display   Initial Impression / Assessment and Plan / ED Course  I have reviewed the triage vital signs and the nursing notes.  Pertinent labs & imaging results that were available during my care of the patient were reviewed by me and considered in my medical decision making (see chart for details).     Currently rates his chest pain as 1/10. Refusing nitroglycerin. Troponin is normal. EKG without ischemic findings. Discussed with hospitalist and will admit for observation and rule out. Final Clinical Impressions(s) / ED Diagnoses   Final diagnoses:  Nonspecific chest pain    New Prescriptions New Prescriptions   No medications on file     Julianne Rice,  MD 04/05/16 2325

## 2016-04-06 ENCOUNTER — Observation Stay (HOSPITAL_BASED_OUTPATIENT_CLINIC_OR_DEPARTMENT_OTHER): Payer: Medicare HMO

## 2016-04-06 ENCOUNTER — Encounter (HOSPITAL_COMMUNITY): Payer: Self-pay | Admitting: *Deleted

## 2016-04-06 DIAGNOSIS — R9431 Abnormal electrocardiogram [ECG] [EKG]: Secondary | ICD-10-CM | POA: Diagnosis not present

## 2016-04-06 DIAGNOSIS — R079 Chest pain, unspecified: Secondary | ICD-10-CM

## 2016-04-06 LAB — TROPONIN I: Troponin I: <0.03 ng/mL (ref <0.03)

## 2016-04-06 LAB — ECHOCARDIOGRAM COMPLETE
HEIGHTINCHES: 68 in
WEIGHTICAEL: 2305.6 [oz_av]

## 2016-04-06 MED ORDER — ENSURE ENLIVE PO LIQD
237.0000 mL | Freq: Two times a day (BID) | ORAL | Status: DC
  Administered 2016-04-06: 237 mL via ORAL

## 2016-04-06 MED ORDER — ACETAMINOPHEN 325 MG PO TABS: 650.0000 mg | ORAL_TABLET | ORAL | Status: DC | PRN

## 2016-04-06 MED ORDER — IBUPROFEN 800 MG PO TABS
800.0000 mg | ORAL_TABLET | Freq: Four times a day (QID) | ORAL | Status: DC | PRN
  Administered 2016-04-06: 800 mg via ORAL
  Filled 2016-04-06: qty 1

## 2016-04-06 MED ORDER — ASPIRIN EC 325 MG PO TBEC
325.0000 mg | DELAYED_RELEASE_TABLET | Freq: Every day | ORAL | Status: DC
  Administered 2016-04-06: 325 mg via ORAL
  Filled 2016-04-06: qty 1

## 2016-04-06 MED ORDER — ASPIRIN EC 81 MG PO TBEC: 81.0000 mg | DELAYED_RELEASE_TABLET | Freq: Every day | ORAL | 1 refills | Status: AC

## 2016-04-06 MED ORDER — ONDANSETRON HCL 4 MG/2ML IJ SOLN: 4.0000 mg | Freq: Four times a day (QID) | INTRAMUSCULAR | Status: DC | PRN

## 2016-04-06 MED ORDER — GI COCKTAIL ~~LOC~~: 30.0000 mL | Freq: Four times a day (QID) | ORAL | Status: DC | PRN

## 2016-04-06 NOTE — Progress Notes (Signed)
Pt IV and telemetry removed, tolerated well.  Reviewed discharge instructions with pt and answered questions at this time.   

## 2016-04-06 NOTE — Discharge Summary (Signed)
Physician Discharge Summary  Ruben Schmidt YQM:[id number] DOB: Sep 28, 1955 DOA: 04/05/2016  PCP: Wende Neighbors, MD  Admit date: 04/05/2016 Discharge date: 04/06/2016  Admitted From: Home.  Disposition:  Home.   Recommendations for Outpatient Follow-up:  1. Follow up with PCP in 1-2 weeks   Home Health: None.  Equipment/Devices: None.  Discharge Condition: Pain free.  CODE STATUS: FULL CODE.  Diet recommendation: cardiac.   Brief/Interim Summary: Patient was admitted for cardiac r/out, presented with atypical chest pain for years, by dr Shanon Brow on April 06, 2016.  As per her H and P:  " Ruben Schmidt is a 61 y.o. male with medical history significant of TIA comes in with sscp that radiates across his chest that has been occurring on/off for years.  Pt reports he gets this due to anxiety attacks and he usually smokes a joint and it goes away.  Tonight this happened in front of a friend who called 911.  He is not happy about being in the hospital.  He has been cursing out the ED nursing staff.  He is upset that he had a longer than expected wait in the ED and does not know why he has been hospitalized.  Entire interview took place with floor nurse in room.  He denies any chest pain right now.  His pain earlier was relieved with taking aspirin with EMS.  No fevers.  No cough.  No swelling in his legs.  He has never had a cardiac work up.  These "episodes" have been occurring for 15 years.  The only medicine he takes for his anxiety attacks is marijuana.  He denies any other drug use.  Pt referred for admission for possible ACS.    HOSPITAL COURSE:  Patient was admitted though he did not want to stay, and would have signed out AMA this morning, if he was n't discharged.  Having said that, his atypical chest pain was likely non cardiac, and his EKG, troponins, and ECHO were all negative.  He is leaving this morning, one way or the other, he said.  He will be discharged on 81mg  of ASA, advised not to  smoke, and recommended that he follow up with his PCP for further evaluation and for routine health maintenance.  Thanks.  Discharge Diagnoses:  Principal Problem:   Nonspecific chest pain    Discharge Instructions  Discharge Instructions    Diet - low sodium heart healthy    Complete by:  As directed    Increase activity slowly    Complete by:  As directed      Allergies as of 04/06/2016      Reactions   Fluvirin [flu Virus Vaccine] Hives      Medication List    STOP taking these medications   ibuprofen 200 MG tablet Commonly known as:  ADVIL,MOTRIN   naproxen sodium 220 MG tablet Commonly known as:  ANAPROX     TAKE these medications   aspirin EC 81 MG tablet Take 1 tablet (81 mg total) by mouth daily.       Allergies  Allergen Reactions  . Fluvirin [Flu Virus Vaccine] Hives    Consultations:  NONE>    Procedures/Studies: Dg Chest 2 View  Result Date: 04/05/2016 CLINICAL DATA:  61 year old male with left-sided chest pain. EXAM: CHEST  2 VIEW COMPARISON:  Chest radiograph dated 11/18/2009 FINDINGS: There is mild emphysematous changes of the lungs. No focal consolidation, pleural effusion, or pneumothorax. Biapical pleural scarring noted. No acute osseous  pathology. IMPRESSION: No active cardiopulmonary disease. Electronically Signed   By: Anner Crete M.D.   On: 04/05/2016 23:12       Subjective:  No further chest pain.    Discharge Exam: Vitals:   04/06/16 0240 04/06/16 0640  BP: 135/78 111/62  Pulse: 82 69  Resp: 18 18  Temp: 98.4 F (36.9 C) 98.3 F (36.8 C)   Vitals:   04/06/16 0100 04/06/16 0130 04/06/16 0240 04/06/16 0640  BP: 115/73 118/68 135/78 111/62  Pulse: 72 80 82 69  Resp:  18 18 18   Temp:   98.4 F (36.9 C) 98.3 F (36.8 C)  TempSrc:   Oral Oral  SpO2: 97% 96% 94% 98%  Weight:   65.4 kg (144 lb 1.6 oz)   Height:   5\' 8"  (1.727 m)     General: Pt is alert, awake, not in acute distress Cardiovascular: RRR, S1/S2 +,  no rubs, no gallops Respiratory: CTA bilaterally, no wheezing, no rhonchi Abdominal: Soft, NT, ND, bowel sounds + Extremities: no edema, no cyanosis    The results of significant diagnostics from this hospitalization (including imaging, microbiology, ancillary and laboratory) are listed below for reference.     Microbiology: No results found for this or any previous visit (from the past 240 hour(s)).   Labs: BNP (last 3 results) No results for input(s): BNP in the last 8760 hours. Basic Metabolic Panel:  Recent Labs Lab 04/05/16 2211  NA 139  K 3.7  CL 106  CO2 25  GLUCOSE 100*  BUN 18  CREATININE 0.96  CALCIUM 9.6   CBC:  Recent Labs Lab 04/05/16 2211  WBC 10.6*  HGB 14.9  HCT 42.7  MCV 91.6  PLT 322   Cardiac Enzymes:  Recent Labs Lab 04/06/16 0455 04/06/16 0837  TROPONINI <0.03 <0.03   Urinalysis    Component Value Date/Time   COLORURINE YELLOW 05/04/2014 [place] 05/04/2014 1637   LABSPEC 1.020 05/04/2014 1637   PHURINE 5.5 05/04/2014 1637   GLUCOSEU NEGATIVE 05/04/2014 1637   HGBUR NEGATIVE 05/04/2014 1637   [place] 05/04/2014 1637   KETONESUR NEGATIVE 05/04/2014 1637   PROTEINUR NEGATIVE 05/04/2014 1637   UROBILINOGEN 0.2 05/04/2014 1637   NITRITE NEGATIVE 05/04/2014 1637   LEUKOCYTESUR NEGATIVE 05/04/2014 1637    Time coordinating discharge: Over 30 minutes SIGNED:  Orvan Falconer, MD FACP [facility] 04/06/2016, 1:51 PM   If 7PM-7AM, please contact night-coverage [email or web address] Password [email or web address]

## 2016-04-06 NOTE — H&P (Signed)
History and Physical    Ruben Schmidt ZOX:[id number] DOB: [birth year] DOA: 04/05/2016  PCP: Wende Neighbors, MD  Patient coming from: home  Chief Complaint:  Chest pain  HPI: Ruben Schmidt is a 61 y.o. male with medical history significant of TIA comes in with sscp that radiates across his chest that has been occurring on/off for years.  Pt reports he gets this due to anxiety attacks and he usually smokes a joint and it goes away.  Tonight this happened in front of a friend who called 911.  He is not happy about being in the hospital.  He has been cursing out the ED nursing staff.  He is upset that he had a longer than expected wait in the ED and does not know why he has been hospitalized.  Entire interview took place with floor nurse in room.  He denies any chest pain right now.  His pain earlier was relieved with taking aspirin with EMS.  No fevers.  No cough.  No swelling in his legs.  He has never had a cardiac work up.  These "episodes" have been occurring for 15 years.  The only medicine he takes for his anxiety attacks is marijuana.  He denies any other drug use.  Pt referred for admission for possible ACS.   Review of Systems: As per HPI otherwise 10 point review of systems negative.   Past Medical History:  Diagnosis Date  . Arthritis   . Asthma   . Back injury   . Stroke [facility])     Past Surgical History:  Procedure Laterality Date  . sinus cavity    . SKIN CANCER EXCISION       reports that he has been smoking Cigarettes.  He has never used smokeless tobacco. He reports that he drinks alcohol. He reports that he uses drugs, including Marijuana.  Allergies  Allergen Reactions  . Fluvirin [Flu Virus Vaccine] Hives    No family history on file. no premature CAD  Prior to Admission medications   Medication Sig Start Date End Date Taking? Authorizing Provider  ibuprofen (ADVIL,MOTRIN) 200 MG tablet Take 600 mg by mouth daily.   Yes Historical Provider, MD  naproxen sodium  (ANAPROX) 220 MG tablet Take 440 mg by mouth 3 (three) times daily as needed (for pain).   Yes Historical Provider, MD    Physical Exam: Vitals:   04/05/16 2129 04/05/16 2137 04/05/16 2200  BP:  120/78 109/73  Pulse:  82 81  Resp:  18 13  Temp:  98.5 F (36.9 C)   TempSrc:  Oral   SpO2:  98% 98%  Weight: 68 kg (150 lb)    Height: 5\' 8"  (1.727 m)      Constitutional: NAD, calm, comfortable Vitals:   04/05/16 2129 04/05/16 2137 04/05/16 2200  BP:  120/78 109/73  Pulse:  82 81  Resp:  18 13  Temp:  98.5 F (36.9 C)   TempSrc:  Oral   SpO2:  98% 98%  Weight: 68 kg (150 lb)    Height: 5\' 8"  (1.727 m)     Eyes: PERRL, lids and conjunctivae normal ENMT: Mucous membranes are moist. Posterior pharynx clear of any exudate or lesions.Normal dentition.  Neck: normal, supple, no masses, no thyromegaly Respiratory: clear to auscultation bilaterally, no wheezing, no crackles. Normal respiratory effort. No accessory muscle use.  Cardiovascular: Regular rate and rhythm, no murmurs / rubs / gallops. No extremity edema. 2+ pedal pulses. No carotid bruits.  Abdomen: no tenderness, no masses palpated. No hepatosplenomegaly. Bowel sounds positive.  Musculoskeletal: no clubbing / cyanosis. No joint deformity upper and lower extremities. Good ROM, no contractures. Normal muscle tone.  Skin: no rashes, lesions, ulcers. No induration Neurologic: CN 2-12 grossly intact. Sensation intact, DTR normal. Strength 5/5 in all 4.  Psychiatric: Normal judgment and insight. Alert and oriented x 3. Normal mood.    Labs on Admission: I have personally reviewed following labs and imaging studies  CBC:  Recent Labs Lab 04/05/16 2211  WBC 10.6*  HGB 14.9  HCT 42.7  MCV 91.6  PLT 121   Basic Metabolic Panel:  Recent Labs Lab 04/05/16 2211  NA 139  K 3.7  CL 106  CO2 25  GLUCOSE 100*  BUN 18  CREATININE 0.96  CALCIUM 9.6   GFR: Estimated Creatinine Clearance: 79.7 mL/min (by C-G formula  based on SCr of 0.96 mg/dL).  Radiological Exams on Admission: Dg Chest 2 View  Result Date: 04/05/2016 CLINICAL DATA:  61 year old male with left-sided chest pain. EXAM: CHEST  2 VIEW COMPARISON:  Chest radiograph dated 11/18/2009 FINDINGS: There is mild emphysematous changes of the lungs. No focal consolidation, pleural effusion, or pneumothorax. Biapical pleural scarring noted. No acute osseous pathology. IMPRESSION: No active cardiopulmonary disease. Electronically Signed   By: Anner Crete M.D.   On: 04/05/2016 23:12    EKG: Independently reviewed. nsr no acute issues   Assessment/Plan 61 yo male with chest pain  Principal Problem:   Nonspecific chest pain- serial trop.  Cardiac echo in am.  Aspirin.       DVT prophylaxis:  scds Code Status:  full Family Communication:  none  Disposition Plan:  Per day team Consults called:  none Admission status:  observation   Christophe Rising A MD [facility]  If 7PM-7AM, please contact night-coverage [email or web address] Password [email or web address]  04/06/2016, 12:02 AM

## 2016-04-06 NOTE — Progress Notes (Signed)
*  PRELIMINARY RESULTS* Echocardiogram 2D Echocardiogram has been performed.  Ruben Schmidt 04/06/2016, 11:31 AM

## 2016-04-06 NOTE — ED Notes (Signed)
Went to the room to take patient to the floor and patient was cursing at me and saying that he want to leave and I could unhook him from this shit and let him go home.  That he was fine and they did not find anything wrong with him and he wanted to leave.  I unhooked patient from the monitor and he moved to the end of the bed, was holding his head, and just sat there.  I informed the patient that he could either sign out AMA or he could be admitted upstairs.  After stating that he would go upstairs, he moved back onto the stretcher and was transported to the floor.  Informed the RN on the floor and Dr. Shanon Brow that patient had been rude, cursing, and when he moved from our stretcher to his bed there was a knife in his back pocket and that security should be called.  At that time security was notified.

## 2016-04-06 NOTE — Progress Notes (Signed)
Pt has knives, blades, and bullets locked up by Animal nutritionist.

## 2017-01-22 ENCOUNTER — Observation Stay (HOSPITAL_COMMUNITY)
Admission: EM | Admit: 2017-01-22 | Discharge: 2017-01-23 | Disposition: A | Discharge status: Home / Self Care | Payer: Medicare HMO | Attending: Internal Medicine | Admitting: Internal Medicine

## 2017-01-22 ENCOUNTER — Emergency Department (HOSPITAL_COMMUNITY): Payer: Medicare HMO

## 2017-01-22 ENCOUNTER — Encounter (HOSPITAL_COMMUNITY): Payer: Self-pay | Admitting: *Deleted

## 2017-01-22 ENCOUNTER — Other Ambulatory Visit: Payer: Self-pay

## 2017-01-22 DIAGNOSIS — Z7982 Long term (current) use of aspirin: Secondary | ICD-10-CM | POA: Insufficient documentation

## 2017-01-22 DIAGNOSIS — Z85828 Personal history of other malignant neoplasm of skin: Secondary | ICD-10-CM | POA: Diagnosis not present

## 2017-01-22 DIAGNOSIS — F1210 Cannabis abuse, uncomplicated: Secondary | ICD-10-CM | POA: Diagnosis not present

## 2017-01-22 DIAGNOSIS — F17210 Nicotine dependence, cigarettes, uncomplicated: Secondary | ICD-10-CM | POA: Insufficient documentation

## 2017-01-22 DIAGNOSIS — R0789 Other chest pain: Principal | ICD-10-CM | POA: Insufficient documentation

## 2017-01-22 DIAGNOSIS — R072 Precordial pain: Secondary | ICD-10-CM | POA: Diagnosis not present

## 2017-01-22 DIAGNOSIS — E785 Hyperlipidemia, unspecified: Secondary | ICD-10-CM | POA: Diagnosis not present

## 2017-01-22 DIAGNOSIS — Z8673 Personal history of transient ischemic attack (TIA), and cerebral infarction without residual deficits: Secondary | ICD-10-CM | POA: Insufficient documentation

## 2017-01-22 DIAGNOSIS — R079 Chest pain, unspecified: Secondary | ICD-10-CM | POA: Diagnosis present

## 2017-01-22 DIAGNOSIS — I2510 Atherosclerotic heart disease of native coronary artery without angina pectoris: Secondary | ICD-10-CM | POA: Insufficient documentation

## 2017-01-22 HISTORY — DX: Depression, unspecified: F32.A

## 2017-01-22 HISTORY — DX: Headache: R51

## 2017-01-22 HISTORY — DX: Headache, unspecified: R51.9

## 2017-01-22 HISTORY — DX: Unspecified malignant neoplasm of skin, unspecified: C44.90

## 2017-01-22 HISTORY — DX: Other chronic pain: G89.29

## 2017-01-22 HISTORY — DX: Anxiety disorder, unspecified: F41.9

## 2017-01-22 HISTORY — DX: Low back pain, unspecified: M54.50

## 2017-01-22 HISTORY — DX: Low back pain: M54.5

## 2017-01-22 HISTORY — DX: Major depressive disorder, single episode, unspecified: F32.9

## 2017-01-22 HISTORY — DX: Pneumonia, unspecified organism: J18.9

## 2017-01-22 LAB — BASIC METABOLIC PANEL
ANION GAP: 10 (ref 5–15)
BUN: 12 mg/dL (ref 6–20)
CHLORIDE: 103 mmol/L (ref 101–111)
CO2: 23 mmol/L (ref 22–32)
Calcium: 9.9 mg/dL (ref 8.9–10.3)
Creatinine, Ser: 1.13 mg/dL (ref 0.61–1.24)
GFR calc Af Amer: >60 mL/min (ref >60)
Glucose, Bld: 136 mg/dL — ABNORMAL HIGH (ref 65–99)
POTASSIUM: 4 mmol/L (ref 3.5–5.1)
Sodium: 136 mmol/L (ref 135–145)

## 2017-01-22 LAB — CBC
HCT: 42.1 % (ref 39.0–52.0)
HEMATOCRIT: 45.9 % (ref 39.0–52.0)
HEMOGLOBIN: 15.9 g/dL (ref 13.0–17.0)
Hemoglobin: 14.2 g/dL (ref 13.0–17.0)
MCH: 32.2 pg (ref 26.0–34.0)
MCH: 30.9 pg (ref 26.0–34.0)
MCHC: 34.6 g/dL (ref 30.0–36.0)
MCHC: 33.7 g/dL (ref 30.0–36.0)
MCV: 92.9 fL (ref 78.0–100.0)
MCV: 91.5 fL (ref 78.0–100.0)
Platelets: 383 10*3/uL (ref 150–400)
Platelets: 349 10*3/uL (ref 150–400)
RBC: 4.94 MIL/uL (ref 4.22–5.81)
RBC: 4.6 MIL/uL (ref 4.22–5.81)
RDW: 13 % (ref 11.5–15.5)
RDW: 12.6 % (ref 11.5–15.5)
WBC: 10 10*3/uL (ref 4.0–10.5)
WBC: 10.1 10*3/uL (ref 4.0–10.5)

## 2017-01-22 LAB — CREATININE, SERUM
Creatinine, Ser: 1.02 mg/dL (ref 0.61–1.24)
GFR calc Af Amer: >60 mL/min (ref >60)
GFR calc non Af Amer: >60 mL/min (ref >60)

## 2017-01-22 LAB — TROPONIN I
Troponin I: <0.03 ng/mL (ref <0.03)
Troponin I: <0.03 ng/mL (ref <0.03)

## 2017-01-22 LAB — LIPID PANEL
Cholesterol: 191 mg/dL (ref 0–200)
HDL: 53 mg/dL (ref >40)
LDL CALC: 113 mg/dL — AB (ref 0–99)
TRIGLYCERIDES: 123 mg/dL (ref <150)
Total CHOL/HDL Ratio: 3.6 RATIO
VLDL: 25 mg/dL (ref 0–40)

## 2017-01-22 LAB — HEMOGLOBIN A1C
Hgb A1c MFr Bld: 5.6 % (ref 4.8–5.6)
Mean Plasma Glucose: 114.02 mg/dL

## 2017-01-22 LAB — I-STAT TROPONIN, ED: Troponin i, poc: 0.01 ng/mL (ref 0.00–0.08)

## 2017-01-22 MED ORDER — NITROGLYCERIN 0.4 MG SL SUBL
0.4000 mg | SUBLINGUAL_TABLET | SUBLINGUAL | Status: DC | PRN
  Administered 2017-01-22 (×4): 0.4 mg via SUBLINGUAL
  Filled 2017-01-22 (×2): qty 1

## 2017-01-22 MED ORDER — ONDANSETRON HCL 4 MG PO TABS: 4.0000 mg | ORAL_TABLET | Freq: Four times a day (QID) | ORAL | Status: DC | PRN

## 2017-01-22 MED ORDER — POLYETHYLENE GLYCOL 3350 17 G PO PACK: 17.0000 g | PACK | Freq: Every day | ORAL | Status: DC | PRN

## 2017-01-22 MED ORDER — SODIUM CHLORIDE 0.9% FLUSH
3.0000 mL | Freq: Two times a day (BID) | INTRAVENOUS | Status: DC
  Administered 2017-01-22: 3 mL via INTRAVENOUS

## 2017-01-22 MED ORDER — ACETAMINOPHEN 650 MG RE SUPP: 650.0000 mg | Freq: Four times a day (QID) | RECTAL | Status: DC | PRN

## 2017-01-22 MED ORDER — ONDANSETRON HCL 4 MG/2ML IJ SOLN: 4.0000 mg | Freq: Four times a day (QID) | INTRAMUSCULAR | Status: DC | PRN

## 2017-01-22 MED ORDER — ENOXAPARIN SODIUM 40 MG/0.4ML ~~LOC~~ SOLN
40.0000 mg | SUBCUTANEOUS | Status: DC
  Administered 2017-01-22: 40 mg via SUBCUTANEOUS
  Filled 2017-01-22: qty 0.4

## 2017-01-22 MED ORDER — ASPIRIN EC 81 MG PO TBEC
81.0000 mg | DELAYED_RELEASE_TABLET | Freq: Every day | ORAL | Status: DC
  Administered 2017-01-23: 81 mg via ORAL
  Filled 2017-01-22: qty 1

## 2017-01-22 MED ORDER — ACETAMINOPHEN 325 MG PO TABS
650.0000 mg | ORAL_TABLET | Freq: Four times a day (QID) | ORAL | Status: DC | PRN
  Administered 2017-01-22: 650 mg via ORAL
  Filled 2017-01-22: qty 2

## 2017-01-22 MED ORDER — ASPIRIN EC 325 MG PO TBEC: 325.0000 mg | DELAYED_RELEASE_TABLET | Freq: Once | ORAL | Status: DC

## 2017-01-22 NOTE — ED Triage Notes (Signed)
Pt is here with chest pain, tightness, numbness in arms, SOB, and dizziness.  Pt states symptoms started at 6am.  Pt reports some wheezing and it is not normal for him.  Pt is having sharp pains now radiating to chest and then had to get parallel in the floor.  Reports he has back issues

## 2017-01-22 NOTE — ED Provider Notes (Signed)
[place] EMERGENCY DEPARTMENT Provider Note   CSN: [id number] Arrival date & time: 01/22/17  1122     History   Chief Complaint Chief Complaint  Patient presents with  . Chest Pain    HPI Ruben Schmidt is a 62 y.o. male.  Burning/tightness sensation in anterior chest at 6:30 AM with associated arm numbness.  Patient is a smoker.  Status post stroke in 2005 with left arm weakness.  No previous MI.  He was nauseated and dyspneic, but no diaphoresis.  Severity of symptoms is moderate.  Nothing makes symptoms better or worse      Past Medical History:  Diagnosis Date  . Arthritis   . Asthma   . Back injury   . Stroke [facility])     Patient Active Problem List   Diagnosis Date Noted  . Chest pain 01/22/2017  . Nonspecific chest pain 04/05/2016  . Pneumothorax 07/20/2013  . Broken rib 07/20/2013    Past Surgical History:  Procedure Laterality Date  . sinus cavity    . SKIN CANCER EXCISION         Home Medications    Prior to Admission medications   Medication Sig Start Date End Date Taking? Authorizing Provider  aspirin EC 81 MG tablet Take 1 tablet (81 mg total) by mouth daily. 04/06/16   Orvan Falconer, MD    Family History No family history on file.  Social History Social History   Tobacco Use  . Smoking status: Current Every Day Smoker    Types: Cigarettes  . Smokeless tobacco: Never Used  Substance Use Topics  . Alcohol use: Yes    Comment: occ  . Drug use: Yes    Types: Marijuana     Allergies   Fluvirin [flu virus vaccine]   Review of Systems Review of Systems  All other systems reviewed and are negative.    Physical Exam Updated Vital Signs BP 116/64   Pulse 77   Temp 98.5 F (36.9 C) (Oral)   Resp 13   SpO2 97%   Physical Exam  Constitutional: He is oriented to person, place, and time. He appears well-developed and well-nourished.  HENT:  Head: Normocephalic and atraumatic.  Eyes: Conjunctivae are normal.   Neck: Neck supple.  Cardiovascular: Normal rate and regular rhythm.  Pulmonary/Chest: Effort normal and breath sounds normal.  Abdominal: Soft. Bowel sounds are normal.  Musculoskeletal: Normal range of motion.  Neurological: He is alert and oriented to person, place, and time.  Skin: Skin is warm and dry.  Psychiatric: He has a normal mood and affect. His behavior is normal.  Nursing note and vitals reviewed.    ED Treatments / Results  Labs (all labs ordered are listed, but only abnormal results are displayed) Labs Reviewed  BASIC METABOLIC PANEL - Abnormal; Notable for the following components:      Result Value   Glucose, Bld 136 (*)    All other components within normal limits  CBC  TROPONIN I  I-STAT TROPONIN, ED    EKG  EKG Interpretation None      Date: 01/22/2017  Rate: 94  Rhythm: normal sinus rhythm  QRS Axis: right  Intervals: normal  ST/T Wave abnormalities: normal  Conduction Disutrbances: none  Narrative Interpretation: unremarkable     Radiology Dg Chest 2 View  Result Date: 01/22/2017 CLINICAL DATA:  Chest pain.  Fall EXAM: CHEST  2 VIEW COMPARISON:  04/05/2016 FINDINGS: Cardiac and mediastinal contours are normal. Apical scarring  and bleb formation bilaterally. Negative for heart failure. Negative for infiltrate or effusion. No pneumothorax. Negative skeletal structures. IMPRESSION: No active cardiopulmonary disease. Electronically Signed   By: Franchot Gallo M.D.   On: 01/22/2017 12:58   Dg Shoulder Right  Result Date: 01/22/2017 CLINICAL DATA:  Fall.  Right shoulder pain EXAM: RIGHT SHOULDER - 2+ VIEW COMPARISON:  None. FINDINGS: There is no evidence of fracture or dislocation. There is no evidence of arthropathy or other focal bone abnormality. Soft tissues are unremarkable. IMPRESSION: Negative. Electronically Signed   By: Franchot Gallo M.D.   On: 01/22/2017 12:58    Procedures Procedures (including critical care time)  Medications Ordered  in ED Medications  nitroGLYCERIN (NITROSTAT) SL tablet 0.4 mg (0.4 mg Sublingual Given 01/22/17 1517)  aspirin EC tablet 325 mg (not administered)     Initial Impression / Assessment and Plan / ED Course  I have reviewed the triage vital signs and the nursing notes.  Pertinent labs & imaging results that were available during my care of the patient were reviewed by me and considered in my medical decision making (see chart for details).     Patient with moderate cardiac risk factors presents with chest tightness.  He is hemodynamically stable.  EKG and first troponin negative.  Discussed with cardiology.  They will consult.  Admit to general medicine.  Final Clinical Impressions(s) / ED Diagnoses   Final diagnoses:  Chest pain, unspecified type    ED Discharge Orders    None       Nat Christen, MD 01/22/17 1553

## 2017-01-22 NOTE — H&P (Signed)
History and Physical    Ruben Schmidt XTK:[id number] DOB: 11/21/1955 DOA: 01/22/2017  PCP: Celene Squibb, MD  Patient coming from: Home  Chief Complaint: Chest pain/tightness  HPI: Ruben Schmidt is a 62 y.o. male with medical history significant of tobacco abuse, previous stroke who presents with chest pain/tightness. He states today's episode woke him up at 6am. It lasted about 2 hours. He drank some coffee, smoked 3 cigarettes and went to work. He became dizzy with 7 out of 10 chest pain and tightness and his boss referred him to be evaluated in the ED. He describes his chest pain as right-sided sharp chest pain that goes straight through to his back associated with a band-like tightness around his entire chest.  He states that he has about 3-4 episodes of this kind of chest pain every year; he was hospitalized at [facility] in March 2018 for similar episode.  He states that he typically ignores his chest pains as it is usually not this bad. Currently, his chest pain has subsided and is about 2 out of 10.  He is a current everyday smoker and smokes about 1-2 packs per day daily.  He has been a smoker since she was 62 years old.  ED Course: Troponin negative x 2. Cardiology consulted by EDP.   Review of Systems: As per HPI otherwise 10 point review of systems negative.   Past Medical History:  Diagnosis Date  . Arthritis   . Asthma   . Back injury   . Stroke [facility])     Past Surgical History:  Procedure Laterality Date  . sinus cavity    . SKIN CANCER EXCISION       reports that he has been smoking cigarettes.  he has never used smokeless tobacco. He reports that he drinks alcohol. He reports that he uses drugs. Drug: Marijuana.  Allergies  Allergen Reactions  . Fluvirin [Flu Virus Vaccine] Hives    No family history on file.   Prior to Admission medications   Medication Sig Start Date End Date Taking? Authorizing Provider  aspirin EC 81 MG tablet Take 1 tablet (81 mg  total) by mouth daily. 04/06/16   Orvan Falconer, MD    Physical Exam: Vitals:   01/22/17 1230 01/22/17 1400 01/22/17 1430 01/22/17 1500  BP: 131/66 119/67 125/69 116/64  Pulse: 79 (!) 58 69 77  Resp: 19 15  13   Temp:      TempSrc:      SpO2: 95% 96% 97% 97%    Constitutional: NAD, calm, comfortable Eyes: PERRL, lids and conjunctivae normal ENMT: Mucous membranes are moist. Posterior pharynx clear of any exudate or lesions.Normal dentition.  Neck: normal, supple, no masses, no thyromegaly Respiratory: clear to auscultation bilaterally, no wheezing, no crackles. Normal respiratory effort. No accessory muscle use.   Cardiovascular: Regular rate and rhythm, no murmurs / rubs / gallops. No extremity edema. 2+ pedal pulses.  Abdomen: no tenderness, no masses palpated. No hepatosplenomegaly. Bowel sounds positive.  Musculoskeletal: no clubbing / cyanosis. No joint deformity upper and lower extremities. No contractures. Normal muscle tone.  Skin: no rashes, lesions, ulcers. No induration Neurologic: CN 2-12 grossly intact. Strength 5/5 in all 4.  Psychiatric: Normal judgment and insight. Alert and oriented x 3. Normal mood.   Labs on Admission: I have personally reviewed following labs and imaging studies  CBC: Recent Labs  Lab 01/22/17 1137  WBC 10.0  HGB 15.9  HCT 45.9  MCV 92.9  PLT  761   Basic Metabolic Panel: Recent Labs  Lab 01/22/17 1137  NA 136  K 4.0  CL 103  CO2 23  GLUCOSE 136*  BUN 12  CREATININE 1.13  CALCIUM 9.9   GFR: CrCl cannot be calculated (Unknown ideal weight.). Liver Function Tests: No results for input(s): AST, ALT, ALKPHOS, BILITOT, PROT, ALBUMIN in the last 168 hours. No results for input(s): LIPASE, AMYLASE in the last 168 hours. No results for input(s): AMMONIA in the last 168 hours. Coagulation Profile: No results for input(s): INR, PROTIME in the last 168 hours. Cardiac Enzymes: No results for input(s): CKTOTAL, CKMB, CKMBINDEX, TROPONINI in  the last 168 hours. BNP (last 3 results) No results for input(s): PROBNP in the last 8760 hours. HbA1C: No results for input(s): HGBA1C in the last 72 hours. CBG: No results for input(s): GLUCAP in the last 168 hours. Lipid Profile: No results for input(s): CHOL, HDL, LDLCALC, TRIG, CHOLHDL, LDLDIRECT in the last 72 hours. Thyroid Function Tests: No results for input(s): TSH, T4TOTAL, FREET4, T3FREE, THYROIDAB in the last 72 hours. Anemia Panel: No results for input(s): VITAMINB12, FOLATE, FERRITIN, TIBC, IRON, RETICCTPCT in the last 72 hours. Urine analysis:    Component Value Date/Time   COLORURINE YELLOW 05/04/2014 [place] 05/04/2014 1637   LABSPEC 1.020 05/04/2014 1637   PHURINE 5.5 05/04/2014 1637   GLUCOSEU NEGATIVE 05/04/2014 1637   HGBUR NEGATIVE 05/04/2014 1637   BILIRUBINUR NEGATIVE 05/04/2014 1637   KETONESUR NEGATIVE 05/04/2014 1637   PROTEINUR NEGATIVE 05/04/2014 1637   UROBILINOGEN 0.2 05/04/2014 1637   NITRITE NEGATIVE 05/04/2014 1637   LEUKOCYTESUR NEGATIVE 05/04/2014 1637   Sepsis Labs: !!!!!!!!!!!!!!!!!!!!!!!!!!!!!!!!!!!!!!!!!!!! @LABRCNTIP (procalcitonin:4,lacticidven:4) )No results found for this or any previous visit (from the past 240 hour(s)).   Radiological Exams on Admission: Dg Chest 2 View  Result Date: 01/22/2017 CLINICAL DATA:  Chest pain.  Fall EXAM: CHEST  2 VIEW COMPARISON:  04/05/2016 FINDINGS: Cardiac and mediastinal contours are normal. Apical scarring and bleb formation bilaterally. Negative for heart failure. Negative for infiltrate or effusion. No pneumothorax. Negative skeletal structures. IMPRESSION: No active cardiopulmonary disease. Electronically Signed   By: Franchot Gallo M.D.   On: 01/22/2017 12:58   Dg Shoulder Right  Result Date: 01/22/2017 CLINICAL DATA:  Fall.  Right shoulder pain EXAM: RIGHT SHOULDER - 2+ VIEW COMPARISON:  None. FINDINGS: There is no evidence of fracture or dislocation. There is no evidence of  arthropathy or other focal bone abnormality. Soft tissues are unremarkable. IMPRESSION: Negative. Electronically Signed   By: Franchot Gallo M.D.   On: 01/22/2017 12:58    EKG: Independently reviewed. Normal sinus with right axis deviation   Assessment/Plan Active Problems:   Chest pain   Atypical chest pain -Observation for rule out ACS  -Echo 04/06/2016 showed EF 60-65%, normal wall motion, lipomatous hypertrophy of atrial septum  -Trend troponin -Aspirin  -Cardiology consulted in the ED  Tobacco abuse -Not ready for cessation   DVT prophylaxis: Lovenox Code Status: Full  Family Communication: Family/friend at bedside Disposition Plan: Pending cardiology eval Consults called: Cardiology called by EDP   Admission status: Observation    Severity of Illness: The appropriate patient status for this patient is OBSERVATION. Observation status is judged to be reasonable and necessary in order to provide the required intensity of service to ensure the patient's safety. The patient's presenting symptoms, physical exam findings, and initial radiographic and laboratory data in the context of their medical condition is felt to place them at decreased  risk for further clinical deterioration. Furthermore, it is anticipated that the patient will be medically stable for discharge from the hospital within 2 midnights of admission. The following factors support the patient status of observation.   " The patient's presenting symptoms include chest tightness. " The physical exam findings are unremarkable . " The initial radiographic and laboratory data are negative first troponin.    Dessa Phi, DO [facility] [email or web address] Password [facility] 01/22/2017, 4:17 PM

## 2017-01-22 NOTE — Consult Note (Signed)
Cardiology Consultation:   Patient ID: Ruben Schmidt; [id number]; 1955-11-25   Admit date: 01/22/2017 Date of Consult: 01/22/2017  Primary Care Provider: Celene Squibb, MD Primary Cardiologist: None    Patient Profile:   Ruben Schmidt is a 62 y.o. male with PMH of TIA (2005) and tobacco abuse who is being seen today for the evaluation of chest pain at the request of Dr. Maylene Roes.  History of Present Illness:   Ruben Schmidt is a 62 yo male with PMH of TIA (2005) and tobacco abuse presents today for chest pain that started at [place] on 01/22/17. Patient states the pain was sharp, located just to the right of mid sternum, and woke him from sleep. Pain was associated with SOB but no N/V, diaphoresis. He states that upon standing, he felt acutely dizzy associated with blurry vision, and he fell into his R shoulder. Patient denies LOC or head strike. CP persisted throughout the morning prompting him to present to the ED. He states he has CP episodes a few times a year that typically last for several hours and eventually dissipate after smoking marijuana. He was admitted to [facility] in 03/2016 for CP - work up with negative troponin's, non-ischemic EKG, and Echo without significant abnormalities.   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 Patient denies fever, recent illnesses, abdominal pain, N/V.  In ED: VSS, Troponin negative x2. EKG non-ischemic, CXR without acute findings, R shoulder XR without fracture or dislocation. Cr 1.13 (baseline 0.9). Patient received SL nitro x3 doses with some improvement in CP. Patient still experiencing 8/10 CP at time of interview  Past Medical History:  Diagnosis Date  . Arthritis   . Asthma   . Back injury   . Stroke [facility])     Past Surgical History:  Procedure Laterality Date  . sinus cavity    . SKIN CANCER EXCISION         Inpatient Medications: Scheduled Meds: . aspirin EC  325 mg Oral Once   Continuous Infusions:  PRN Meds: nitroGLYCERIN  Allergies:    Allergies  Allergen Reactions  . Fluvirin [Flu Virus Vaccine] Hives    Social History:   Social History   Socioeconomic History  . Marital status: Legally Separated    Spouse name: Not on file  . Number of children: Not on file  . Years of education: Not on file  . Highest education level: Not on file  Social Needs  . Financial resource strain: Not on file  . Food insecurity - worry: Not on file  . Food insecurity - inability: Not on file  . Transportation needs - medical: Not on file  . Transportation needs - non-medical: Not on file  Occupational History  . Not on file  Tobacco Use  . Smoking status: Current Every Day Smoker    Types: Cigarettes  . Smokeless tobacco: Never Used  Substance and Sexual Activity  . Alcohol use: Yes    Comment: occ  . Drug use: Yes    Types: Marijuana  . Sexual activity: Yes    Birth control/protection: None  Other Topics Concern  . Not on file  Social History Narrative  . Not on file  Smokes 1ppd for the past 45 years   Family History:   Denies family history of heart disease. Notes uncle with DM. Mother with Breat  CA, maternal grandmother with colon CA,  ROS:  Please see the history of present illness.   All other ROS reviewed and negative.     Physical Exam/Data:   Vitals:   01/22/17 1400 01/22/17 1430 01/22/17 1500 01/22/17 1600  BP: 119/67 125/69 116/64 131/79  Pulse: (!) 58 69 77 77  Resp: 15  13 15   Temp:      TempSrc:      SpO2: 96% 97% 97% 97%   No intake or output data in the 24 hours ending 01/22/17 1623 There were no vitals filed for this visit. There is no height or weight on file to calculate BMI.  General:  Well nourished, well developed, laying in bed in NAD HEENT: sclera anicteric  Neck: no JVD Vascular: No carotid bruits; distal pulses 2+  bilaterally Cardiac:  normal S1, S2; RRR; no murmur, gallops, or rubs. Chest wall not TTP  Lungs:  clear to auscultation bilaterally, no wheezing, rhonchi or rales  Abd: NABS, soft, nontender, no hepatomegaly Ext: no edema Musculoskeletal:  No deformities, BUE and BLE strength normal and equal Skin: warm and dry  Neuro:  CNs 2-12 intact, no focal abnormalities noted Psych: somewhat anxious  EKG:  The EKG was personally reviewed and demonstrates: rate 98, NSR, no STE/D, no TWI - poor quality EKG Telemetry:  Telemetry was personally reviewed and demonstrates:  NSR  Relevant CV Studies: ECHO 03/2016 Study Conclusions  - Left ventricle: The cavity size was normal. Systolic function was   normal. The estimated ejection fraction was in the range of 60%   to 65%. Wall motion was normal; there were no regional wall   motion abnormalities. Left ventricular diastolic function   parameters were normal. - Aortic valve: Trileaflet; normal thickness, mildly calcified   leaflets. - Atrial septum: There was increased thickness of the septum,   consistent with lipomatous hypertrophy. - Pulmonary arteries: Systolic pressure could not be accurately   estimated.  Laboratory Data:  Chemistry Recent Labs  Lab 01/22/17 1137  NA 136  K 4.0  CL 103  CO2 23  GLUCOSE 136*  BUN 12  CREATININE 1.13  CALCIUM 9.9  GFRNONAA >60  GFRAA >60  ANIONGAP 10    No results for input(s): PROT, ALBUMIN, AST, ALT, ALKPHOS, BILITOT in the last 168 hours. Hematology Recent Labs  Lab 01/22/17 1137  WBC 10.0  RBC 4.94  HGB 15.9  HCT 45.9  MCV 92.9  MCH 32.2  MCHC 34.6  RDW 13.0  PLT 383   Cardiac Enzymes Recent Labs  Lab 01/22/17 1429  TROPONINI <0.03    Recent Labs  Lab 01/22/17 1146  TROPIPOC 0.01    BNPNo results for input(s): BNP, PROBNP in the last 168 hours.  DDimer No results for input(s): DDIMER in the last 168 hours.  Radiology/Studies:  Dg Chest 2 View  Result Date:  01/22/2017 CLINICAL DATA:  Chest pain.  Fall EXAM: CHEST  2 VIEW COMPARISON:  04/05/2016 FINDINGS: Cardiac and mediastinal contours are normal. Apical scarring and bleb formation bilaterally. Negative for heart failure. Negative for infiltrate or effusion. No pneumothorax. Negative skeletal structures. IMPRESSION: No active cardiopulmonary disease. Electronically Signed   By: Franchot Gallo M.D.   On: 01/22/2017 12:58   Dg Shoulder Right  Result Date: 01/22/2017 CLINICAL DATA:  Fall.  Right shoulder pain EXAM: RIGHT SHOULDER - 2+ VIEW COMPARISON:  None. FINDINGS: There is no evidence of fracture or dislocation. There is no evidence of arthropathy or other focal bone  abnormality. Soft tissues are unremarkable. IMPRESSION: Negative. Electronically Signed   By: Franchot Gallo M.D.   On: 01/22/2017 12:58    Assessment and Plan:   1. CP: patient is a poor historian and difficult to determine a pattern. CP seems atypical in nature - sharp, occurs at rest, resolves with marijuana use - EKG with sinus rhythm, non-ischemic - continue serial EKGs - Trop negative x2 - continue to trend x3 - Monitor on telemetry - Echo from 03/2016 with EF 60-65%, no significant structural abnormalities - Given his inconsistent history (CP occurs multiple times a year or as much as a couple times per month) and significant smoking history, would consider possible LHC to definitely r/o ACS. Will make him NPO after MN for possible cath 01/23/17 - No need for heparin gtt given negative trops x2  - Will check lipids and Hgb A1C for risk stratification.    Cardiology will continue to follow.   For questions or updates, please contact [place] Please consult [email or web address] for contact info under Cardiology/STEMI.   Signed, Abigail Butts, PA-C  01/22/2017 4:23 PM [phone]  I have personally seen and examined this patient with Roby Lofts, PA. I agree with the assessment and plan as outlined above. Ruben Schmidt has  a long standing history of tobacco abuse. He is admitted with chest pain The chest pain began this am. The pain has worsened over the day. The pain comes and goes. He describes similar pain over the past year, occurring 1-2 times per month. The pain was more severe today. No associated dyspnea today but the pain is usually associated with dyspnea and diaphoresis. He was admitted to [facility] in March 2018 with the same complaints and during that admission it appears that he was verbally abusive to the staff and demanded to leave AMA. Echo March 2018 with normal LV size and function. Remote cath in November 2006 showed no evidence of CAD.   He has been smoking for 40 years with a brief cessation period 20 years ago. He does not have DM or HTN. He thinks that he may have HLD but this has not been treated.  EKG reviewed and shows sinus rhythm with poor R wave progression in the precordial leads. Unchanged from EKG in March 2018.  Labs reviewed. Troponin negative x 2. My exam:  General: Well developed, well nourished, NAD  HEENT: OP clear, mucus membranes moist  SKIN: warm, dry. No rashes. Neuro: No focal deficits  Musculoskeletal: Muscle strength 5/5 all ext  Psychiatric: Mood and affect normal  Neck: No JVD, no carotid bruits, no thyromegaly, no lymphadenopathy.  Lungs:Clear bilaterally, no wheezes, rhonci, crackles Cardiovascular: Regular rate and rhythm. No murmurs, gallops or rubs. Abdomen:Soft. Bowel sounds present. Non-tender.  Extremities: No lower extremity edema. Pulses are 2 + in the bilateral DP/PT.  Plan:  1. Chest pain: He has a long standing history of chest pain with normal cath in 2006. His chest pain has been getting progressively worse over the last few months. His pain is severe today.No objective evidence of ischemia thus far. Troponin negative x 2. EKG is unchanged. I have discussed ischemic testing with him today. Given his long standing tobacco abuse and report of  severe waxing and waning chest pain, we need to consider an ischemic evaluation. We will make him NPO tonight and decide on cath vs nuclear stress test in the am. I would favor cardiac cath to exclude CAD given his complaints. No indication for heparin  currently as he has negative troponins.   Lauree Chandler 01/22/2017 6:12 PM

## 2017-01-23 ENCOUNTER — Observation Stay (HOSPITAL_COMMUNITY): Payer: Medicare HMO

## 2017-01-23 DIAGNOSIS — R079 Chest pain, unspecified: Secondary | ICD-10-CM | POA: Diagnosis not present

## 2017-01-23 DIAGNOSIS — R072 Precordial pain: Secondary | ICD-10-CM | POA: Diagnosis not present

## 2017-01-23 LAB — BASIC METABOLIC PANEL
Anion gap: 9 (ref 5–15)
BUN: 13 mg/dL (ref 6–20)
CO2: 24 mmol/L (ref 22–32)
CREATININE: 1.11 mg/dL (ref 0.61–1.24)
Calcium: 9.2 mg/dL (ref 8.9–10.3)
Chloride: 104 mmol/L (ref 101–111)
GFR calc Af Amer: >60 mL/min (ref >60)
GLUCOSE: 81 mg/dL (ref 65–99)
POTASSIUM: 4 mmol/L (ref 3.5–5.1)
SODIUM: 137 mmol/L (ref 135–145)

## 2017-01-23 LAB — HIV ANTIBODY (ROUTINE TESTING W REFLEX): HIV SCREEN 4TH GENERATION: NONREACTIVE

## 2017-01-23 LAB — CBC
HCT: 43.4 % (ref 39.0–52.0)
Hemoglobin: 14.8 g/dL (ref 13.0–17.0)
MCH: 31.9 pg (ref 26.0–34.0)
MCHC: 34.1 g/dL (ref 30.0–36.0)
MCV: 93.5 fL (ref 78.0–100.0)
PLATELETS: 317 10*3/uL (ref 150–400)
RBC: 4.64 MIL/uL (ref 4.22–5.81)
RDW: 13 % (ref 11.5–15.5)
WBC: 7.2 10*3/uL (ref 4.0–10.5)

## 2017-01-23 LAB — TROPONIN I: Troponin I: <0.03 ng/mL (ref <0.03)

## 2017-01-23 MED ORDER — METOPROLOL TARTRATE 25 MG PO TABS
25.0000 mg | ORAL_TABLET | Freq: Once | ORAL | Status: AC
  Administered 2017-01-23: 25 mg via ORAL
  Filled 2017-01-23: qty 1

## 2017-01-23 MED ORDER — NITROGLYCERIN 0.4 MG SL SUBL: 0.4000 mg | SUBLINGUAL_TABLET | SUBLINGUAL | 0 refills | Status: AC | PRN

## 2017-01-23 MED ORDER — NITROGLYCERIN 0.4 MG SL SUBL
SUBLINGUAL_TABLET | SUBLINGUAL | Status: AC
  Filled 2017-01-23: qty 2

## 2017-01-23 MED ORDER — NITROGLYCERIN 0.4 MG SL SUBL
0.8000 mg | SUBLINGUAL_TABLET | SUBLINGUAL | Status: DC | PRN
  Administered 2017-01-23: 0.8 mg via SUBLINGUAL

## 2017-01-23 MED ORDER — ATORVASTATIN CALCIUM 20 MG PO TABS
20.0000 mg | ORAL_TABLET | Freq: Every day | ORAL | Status: DC
  Administered 2017-01-23: 20 mg via ORAL
  Filled 2017-01-23: qty 1

## 2017-01-23 MED ORDER — PANTOPRAZOLE SODIUM 40 MG PO TBEC
40.0000 mg | DELAYED_RELEASE_TABLET | Freq: Every day | ORAL | Status: DC
  Administered 2017-01-23: 40 mg via ORAL
  Filled 2017-01-23: qty 1

## 2017-01-23 MED ORDER — ATORVASTATIN CALCIUM 20 MG PO TABS: 20.0000 mg | ORAL_TABLET | Freq: Every day | ORAL | 0 refills | Status: AC

## 2017-01-23 MED ORDER — IOPAMIDOL (ISOVUE-370) INJECTION 76%
INTRAVENOUS | Status: AC
  Administered 2017-01-23: 80 mL
  Filled 2017-01-23: qty 100

## 2017-01-23 MED ORDER — PANTOPRAZOLE SODIUM 40 MG PO TBEC: 40.0000 mg | DELAYED_RELEASE_TABLET | Freq: Every day | ORAL | 0 refills | Status: AC

## 2017-01-23 NOTE — Progress Notes (Addendum)
Progress Note  Patient Name: Ruben Schmidt Date of Encounter: 01/23/2017  Primary Cardiologist: No primary care provider on file.   Subjective   Chest pain is still present but much improved. He points to one spot over his right chest wall. No dyspnea.   Inpatient Medications    Scheduled Meds: . aspirin EC  325 mg Oral Once  . aspirin EC  81 mg Oral Daily  . enoxaparin (LOVENOX) injection  40 mg Subcutaneous Q24H  . sodium chloride flush  3 mL Intravenous Q12H   Continuous Infusions:  PRN Meds: acetaminophen **OR** acetaminophen, nitroGLYCERIN, ondansetron **OR** ondansetron (ZOFRAN) IV, polyethylene glycol   Vital Signs    Vitals:   01/22/17 1929 01/22/17 1943 01/23/17 0100 01/23/17 0431  BP: (!) 142/76 (!) 109/96  106/65  Pulse:   62 77  Resp: 12 13 13 13   Temp:    98.1 F (36.7 C)  TempSrc:    Oral  SpO2:    100%  Weight:    142 lb 9.6 oz (64.7 kg)    Intake/Output Summary (Last 24 hours) at 01/23/2017 0735 Last data filed at 01/22/2017 2151 Gross per 24 hour  Intake 3 ml  Output -  Net 3 ml   Filed Weights   01/23/17 0431  Weight: 142 lb 9.6 oz (64.7 kg)    Telemetry    Sinus - Personally Reviewed  ECG    No am EKG  Physical Exam   GEN: No acute distress.   Neck: No JVD Cardiac: RRR, no murmurs, rubs, or gallops.  Respiratory: Clear to auscultation bilaterally. GI: Soft, nontender, non-distended  MS: No edema; No deformity. Neuro:  Nonfocal  Psych: Normal affect   Labs    Chemistry Recent Labs  Lab 01/22/17 1137 01/22/17 1720 01/23/17 0352  NA 136  --  137  K 4.0  --  4.0  CL 103  --  104  CO2 23  --  24  GLUCOSE 136*  --  81  BUN 12  --  13  CREATININE 1.13 1.02 1.11  CALCIUM 9.9  --  9.2  GFRNONAA >60 >60 >60  GFRAA >60 >60 >60  ANIONGAP 10  --  9     Hematology Recent Labs  Lab 01/22/17 1137 01/22/17 1720 01/23/17 0352  WBC 10.0 10.1 7.2  RBC 4.94 4.60 4.64  HGB 15.9 14.2 14.8  HCT 45.9 42.1 43.4  MCV 92.9  91.5 93.5  MCH 32.2 30.9 31.9  MCHC 34.6 33.7 34.1  RDW 13.0 12.6 13.0  PLT 383 349 317    Cardiac Enzymes Recent Labs  Lab 01/22/17 1429 01/22/17 1720 01/22/17 2239 01/23/17 0352  TROPONINI <0.03 <0.03 <0.03 <0.03    Recent Labs  Lab 01/22/17 1146  TROPIPOC 0.01     BNPNo results for input(s): BNP, PROBNP in the last 168 hours.   DDimer No results for input(s): DDIMER in the last 168 hours.   Radiology    Dg Chest 2 View  Result Date: 01/22/2017 CLINICAL DATA:  Chest pain.  Fall EXAM: CHEST  2 VIEW COMPARISON:  04/05/2016 FINDINGS: Cardiac and mediastinal contours are normal. Apical scarring and bleb formation bilaterally. Negative for heart failure. Negative for infiltrate or effusion. No pneumothorax. Negative skeletal structures. IMPRESSION: No active cardiopulmonary disease. Electronically Signed   By: Franchot Gallo M.D.   On: 01/22/2017 12:58   Dg Shoulder Right  Result Date: 01/22/2017 CLINICAL DATA:  Fall.  Right shoulder pain EXAM: RIGHT SHOULDER -  2+ VIEW COMPARISON:  None. FINDINGS: There is no evidence of fracture or dislocation. There is no evidence of arthropathy or other focal bone abnormality. Soft tissues are unremarkable. IMPRESSION: Negative. Electronically Signed   By: Franchot Gallo M.D.   On: 01/22/2017 12:58    Cardiac Studies     Patient Profile     62 y.o. male with history of TIA and tobacco abuse admitted with chest pain  Assessment & Plan    1. Chest pain: Many atypical features but pt is a long term smoker. Normal coronaries by cath in 2006. Troponin negative. EKG without ischemic changes on admission. We have discussed stress testing vs cath vs coronary CTA. Given the atypical nature of his pain and lack of objective evidence of ischemia, I think a coronary CTA is appropriate. I will arrange a coronary CTA for today.   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 Normal aortic root.  He can be discharged home today.  We will arrange follow up in our [place] office. Start statin and continue ASA.   For questions or updates, please contact [place] Please consult [email or web address] for contact info under Cardiology/STEMI.      Signed, Lauree Chandler, MD  01/23/2017, 7:35 AM

## 2017-01-23 NOTE — Progress Notes (Signed)
Patient c/o chest pain 4/10 relieved by 1 Nitro SL. PRN Tylenol 650 mg PO given for headache. Will continue to monitor.

## 2017-01-23 NOTE — Care Management Note (Signed)
Case Management Note  Patient Details  Name: Ruben Schmidt MRN: [id number] Date of Birth: October 31, 1955  Subjective/Objective:     Chest pain               Action/Plan: Discharge Planning: Spoke to pt at bedside. Lives alone. Was independent prior to hospital stay. Will continue to follow for dc needs.   PCP Celene Squibb MD  Expected Discharge Date:                 Expected Discharge Plan:  Home/Self Care  In-House Referral:  NA  Discharge planning Services  CM Consult  Post Acute Care Choice:  NA Choice offered to:  Patient  DME Arranged:  N/A DME Agency:  NA  HH Arranged:  NA HH Agency:  NA  Status of Service:  In process, will continue to follow  If discussed at Long Length of Stay Meetings, dates discussed:    Additional Comments:  Erenest Rasher, RN 01/23/2017, 10:29 AM

## 2017-01-23 NOTE — Care Management Obs Status (Signed)
[place] NOTIFICATION   Patient Details  Name: ALONTE WULFF MRN: [id number] Date of Birth: 12-Aug-1955   Medicare Observation Status Notification Given:  Yes    Erenest Rasher, RN 01/23/2017, 11:19 AM

## 2017-01-23 NOTE — Discharge Summary (Signed)
Physician Discharge Summary  Ruben Schmidt IRS:[id number] DOB: 02/06/1955 DOA: 01/22/2017  PCP: Celene Squibb, MD  Admit date: 01/22/2017 Discharge date: 01/23/2017  Admitted From: Home Disposition:  Home  Recommendations for Outpatient Follow-up:  1. Follow up with PCP in 1-2 weeks 2. Follow up with [facility] as an outpatient  3. Please obtain CMP/CBC, Mag, Phos in one week 4. Please follow up on the following pending results:  Home Health: No Equipment/Devices: None   Discharge Condition: Stable  CODE STATUS: FULL CODE Diet recommendation: Heart Healthy Diet   Brief/Interim Summary: Ruben Schmidt a 62 y.o.malewith medical history significant oftobacco abuse, previous stroke who presents with chest pain/tightness. He states today's episode woke him up at 6am. It lasted about 2 hours. He drank some coffee, smoked 3 cigarettes and went to work.He became dizzy with 7 out of 10 chest pain and tightness and his boss referred him to be evaluated in the ED.He describes his chest pain as right-sided sharp chest pain that goes straight through to his back associated with a band-like tightness around his entire chest. He states that he has about 3-4 episodes of this kind of chest pain every year;he was hospitalized at [facility] March 2018 for similar episode. He states that he typically ignores hischest pains as it is usually not this bad. Currently, on the day of admission his chest pain has subsided and is about 2 out of 10. He is a current everyday smoker and smokes about 1-2packs per day daily. He has been a smoker since she was 62 years old.  Was admitted for Chest Pain rule out and Troponin negative x 4. Cardiology consulted by EDP. Cardiology evaluated and recommended CTA; CTA Done and showed mild to moderate CAD. Cardiology cleared patient for D/C and recommended ASA at [place] at D/C. Patient given smoking Cessation counseling but states he will not quit. Deemed  medically stable to D/C Home and follow up with PCP and Cardiology as an outpatient.   Discharge Diagnoses:  Active Problems:   Chest pain  Atypical Chest Pain -Observation for rule out ACS  -Echo 04/06/2016 showed EF 60-65%, normal wall motion, lipomatous hypertrophy of atrial septum  -Trended troponin and was Negative x4 at <0.03 -Given ASA while hospitalized -Cardiology consulted in the ED and underwent Coronary CTA -Coronary CTA showed mild-moderate non-obstructive CAD and a Normal Aortic Root -Per Cardiology can be D/C'd home and follow up with Cardiology at D/C in [place] -Given ASA 81, NTG 0.4 mg SL, and Atorvastatin 20 mg po qHS at D/C -Recommending Heart Healthy Diet  Tobacco abuse -Cessation counseling given but patient states he will continue to smoke  Hyperlipidemia -Patient's Lipid Panel showed Cholesterol of 191, HDL of 53, LDL of 113, TG of 123, and VLDL of 25 -Started Atorvastatin 20 mg po Daily   Marijuana Abuse  -States she will not quit smoking  Discharge Instructions  Discharge Instructions    Call MD for:  difficulty breathing, headache or visual disturbances   Complete by:  As directed    Call MD for:  extreme fatigue   Complete by:  As directed    Call MD for:  hives   Complete by:  As directed    Call MD for:  persistant dizziness or light-headedness   Complete by:  As directed    Call MD for:  persistant nausea and vomiting   Complete by:  As directed    Call MD for:  redness, tenderness, or signs  of infection (pain, swelling, redness, odor or green/yellow discharge around incision site)   Complete by:  As directed    Call MD for:  severe uncontrolled pain   Complete by:  As directed    Call MD for:  temperature >100.4   Complete by:  As directed    Diet - low sodium heart healthy   Complete by:  As directed    Discharge instructions   Complete by:  As directed    Follow up with PCP and Cardiology at D/C. Take medications as prescribed.  If symptoms worsen or change please return to the ED for evaluation.   Increase activity slowly   Complete by:  As directed      Allergies as of 01/23/2017      Reactions   Fluvirin [flu Virus Vaccine] Hives      Medication List    TAKE these medications   aspirin EC 81 MG tablet Take 1 tablet (81 mg total) by mouth daily.   atorvastatin 20 MG tablet Commonly known as:  LIPITOR Take 1 tablet (20 mg total) by mouth daily at 6 PM.   nitroGLYCERIN 0.4 MG SL tablet Commonly known as:  NITROSTAT Place 1 tablet (0.4 mg total) under the tongue every 5 (five) minutes as needed for chest pain.   pantoprazole 40 MG tablet Commonly known as:  PROTONIX Take 1 tablet (40 mg total) by mouth daily.       Allergies  Allergen Reactions  . Fluvirin [Flu Virus Vaccine] Hives   Consultations:  Cardiology Dr. Angelena Form   Procedures/Studies: Dg Chest 2 View  Result Date: 01/22/2017 CLINICAL DATA:  Chest pain.  Fall EXAM: CHEST  2 VIEW COMPARISON:  04/05/2016 FINDINGS: Cardiac and mediastinal contours are normal. Apical scarring and bleb formation bilaterally. Negative for heart failure. Negative for infiltrate or effusion. No pneumothorax. Negative skeletal structures. IMPRESSION: No active cardiopulmonary disease. Electronically Signed   By: Franchot Gallo M.D.   On: 01/22/2017 12:58   Dg Shoulder Right  Result Date: 01/22/2017 CLINICAL DATA:  Fall.  Right shoulder pain EXAM: RIGHT SHOULDER - 2+ VIEW COMPARISON:  None. FINDINGS: There is no evidence of fracture or dislocation. There is no evidence of arthropathy or other focal bone abnormality. Soft tissues are unremarkable. IMPRESSION: Negative. Electronically Signed   By: Franchot Gallo M.D.   On: 01/22/2017 12:58   Ct Coronary Morph W/cta Cor W/score W/ca W/cm &/or Wo/cm  Addendum Date: 01/23/2017   ADDENDUM REPORT: 01/23/2017 11:34 CLINICAL DATA:  Chest pain EXAM: Cardiac CTA MEDICATIONS: Sub lingual nitro. 4mg  and lopressor 50mg  oral  TECHNIQUE: The patient was scanned on a Siemens 161 slice scanner. Gantry rotation speed was 250 msecs. Collimation was .69mm. A 100 kV prospective scan was triggered in the ascending thoracic aorta at 140 HU's with full mA between 35-75% of the R-R interval. Average HR during the scan was 62 bpm. The 3D data set was interpreted on a dedicated work station using MPR, MIP and VRT modes. A total of 80cc of contrast was used. FINDINGS: Non-cardiac: See separate report from [facility]. No significant findings on limited lung and soft tissue windows. Calcium Score: Calcium noted in all 3 major epicardial coronary arteries Aorta:  Normal size 3.0 cm Coronary Arteries: Right dominant with no anomalies LM:  Less than 30% calcific proximal disease LAD:  30-50% mixed plaque in proximal and mid vessel D1:  30% mixed plaque in ostium D2:  Normal Circumflex:  Less than 30% mixed plaque  in proximal and mid vessel OM1:  Normal OM2:  Normal OM3:  Normal RCA:  Dominant 30-50% mixed plaque in the proximal and mid vessel PDA:  Normal PLA:  Normal IMPRESSION: 1.  Calcium score 160 which is 8 th percentile for age and sex 2. CAD but non obstructive see description above Study not suitable for FFR CT due to mis-registration and motion artifact 3.  Normal aortic root 3.0 cm Jenkins Rouge Electronically Signed   By: Jenkins Rouge M.D.   On: 01/23/2017 11:34   Result Date: 01/23/2017 EXAM: OVER-READ INTERPRETATION  CT CHEST The following report is an over-read performed by radiologist Dr. Collene Leyden [facility] on 01/23/2017. This over-read does not include interpretation of cardiac or coronary anatomy or pathology. The coronary CTA interpretation by the cardiologist is attached. COMPARISON:  None. FINDINGS: Vascular: Heart is normal size.  Visualized aorta is normal caliber. Mediastinum/Nodes: No adenopathy in the visualized lower mediastinum or hila. Lungs/Pleura: Visualized lungs are clear. No effusions or acute  bony abnormality. Upper Abdomen: Imaging into the upper abdomen shows no acute findings. Musculoskeletal: Chest wall soft tissues are unremarkable. No acute bony abnormality. IMPRESSION: No acute or significant extracardiac abnormality. Electronically Signed: By: Rolm Baptise M.D. On: 01/23/2017 11:15    Subjective: Seen and examined and denied any more CP. No SOB. Ready to go home.  Discharge Exam: Vitals:   01/23/17 1011 01/23/17 1300  BP: 117/80 117/79  Pulse:  70  Resp: 18 15  Temp:  98 F (36.7 C)  SpO2:     Vitals:   01/23/17 0100 01/23/17 0431 01/23/17 1011 01/23/17 1300  BP:  106/65 117/80 117/79  Pulse: 62 77  70  Resp: 13 13 18 15   Temp:  98.1 F (36.7 C)  98 F (36.7 C)  TempSrc:  Oral  Oral  SpO2:  100%    Weight:  64.7 kg (142 lb 9.6 oz)     General: Pt is alert, awake, not in acute distress Cardiovascular: RRR, S1/S2 +, no rubs, no gallops Respiratory: CTA bilaterally, no wheezing, no rhonchi Abdominal: Soft, NT, ND, bowel sounds + Extremities: no edema, no cyanosis  The results of significant diagnostics from this hospitalization (including imaging, microbiology, ancillary and laboratory) are listed below for reference.    Microbiology: No results found for this or any previous visit (from the past 240 hour(s)).   Labs: BNP (last 3 results) No results for input(s): BNP in the last 8760 hours. Basic Metabolic Panel: Recent Labs  Lab 01/22/17 1137 01/22/17 1720 01/23/17 0352  NA 136  --  137  K 4.0  --  4.0  CL 103  --  104  CO2 23  --  24  GLUCOSE 136*  --  81  BUN 12  --  13  CREATININE 1.13 1.02 1.11  CALCIUM 9.9  --  9.2   Liver Function Tests: No results for input(s): AST, ALT, ALKPHOS, BILITOT, PROT, ALBUMIN in the last 168 hours. No results for input(s): LIPASE, AMYLASE in the last 168 hours. No results for input(s): AMMONIA in the last 168 hours. CBC: Recent Labs  Lab 01/22/17 1137 01/22/17 1720 01/23/17 0352  WBC 10.0 10.1 7.2   HGB 15.9 14.2 14.8  HCT 45.9 42.1 43.4  MCV 92.9 91.5 93.5  PLT 383 349 317   Cardiac Enzymes: Recent Labs  Lab 01/22/17 1429 01/22/17 1720 01/22/17 2239 01/23/17 0352  TROPONINI <0.03 <0.03 <0.03 <0.03   BNP: Invalid input(s): POCBNP CBG:  No results for input(s): GLUCAP in the last 168 hours. D-Dimer No results for input(s): DDIMER in the last 72 hours. Hgb A1c Recent Labs    01/22/17 2239  HGBA1C 5.6   Lipid Profile Recent Labs    01/22/17 2239  CHOL 191  HDL 53  LDLCALC 113*  TRIG 123  CHOLHDL 3.6   Thyroid function studies No results for input(s): TSH, T4TOTAL, T3FREE, THYROIDAB in the last 72 hours.  Invalid input(s): FREET3 Anemia work up No results for input(s): VITAMINB12, FOLATE, FERRITIN, TIBC, IRON, RETICCTPCT in the last 72 hours. Urinalysis    Component Value Date/Time   COLORURINE YELLOW 05/04/2014 1637   APPEARANCEUR CLEAR 05/04/2014 1637   LABSPEC 1.020 05/04/2014 1637   PHURINE 5.5 05/04/2014 1637   GLUCOSEU NEGATIVE 05/04/2014 1637   HGBUR NEGATIVE 05/04/2014 1637   BILIRUBINUR NEGATIVE 05/04/2014 1637   KETONESUR NEGATIVE 05/04/2014 1637   PROTEINUR NEGATIVE 05/04/2014 1637   UROBILINOGEN 0.2 05/04/2014 1637   NITRITE NEGATIVE 05/04/2014 1637   LEUKOCYTESUR NEGATIVE 05/04/2014 1637   Sepsis Labs Invalid input(s): PROCALCITONIN,  WBC,  LACTICIDVEN Microbiology No results found for this or any previous visit (from the past 240 hour(s)).  Time coordinating discharge: 35 minutes  SIGNED:  Kerney Elbe, DO [facility] 01/23/2017, 2:33 PM Pager [phone]  If 7PM-7AM, please contact night-coverage [email or web address] Password [email or web address]

## 2018-07-31 IMAGING — DX DG SHOULDER 2+V*R*
2 series · 2 of 2 positions shown · non-contrast
Comparison: None.

CLINICAL DATA: Fall.  Right shoulder pain

EXAM:
RIGHT SHOULDER - 2+ VIEW

[shoulder y view]
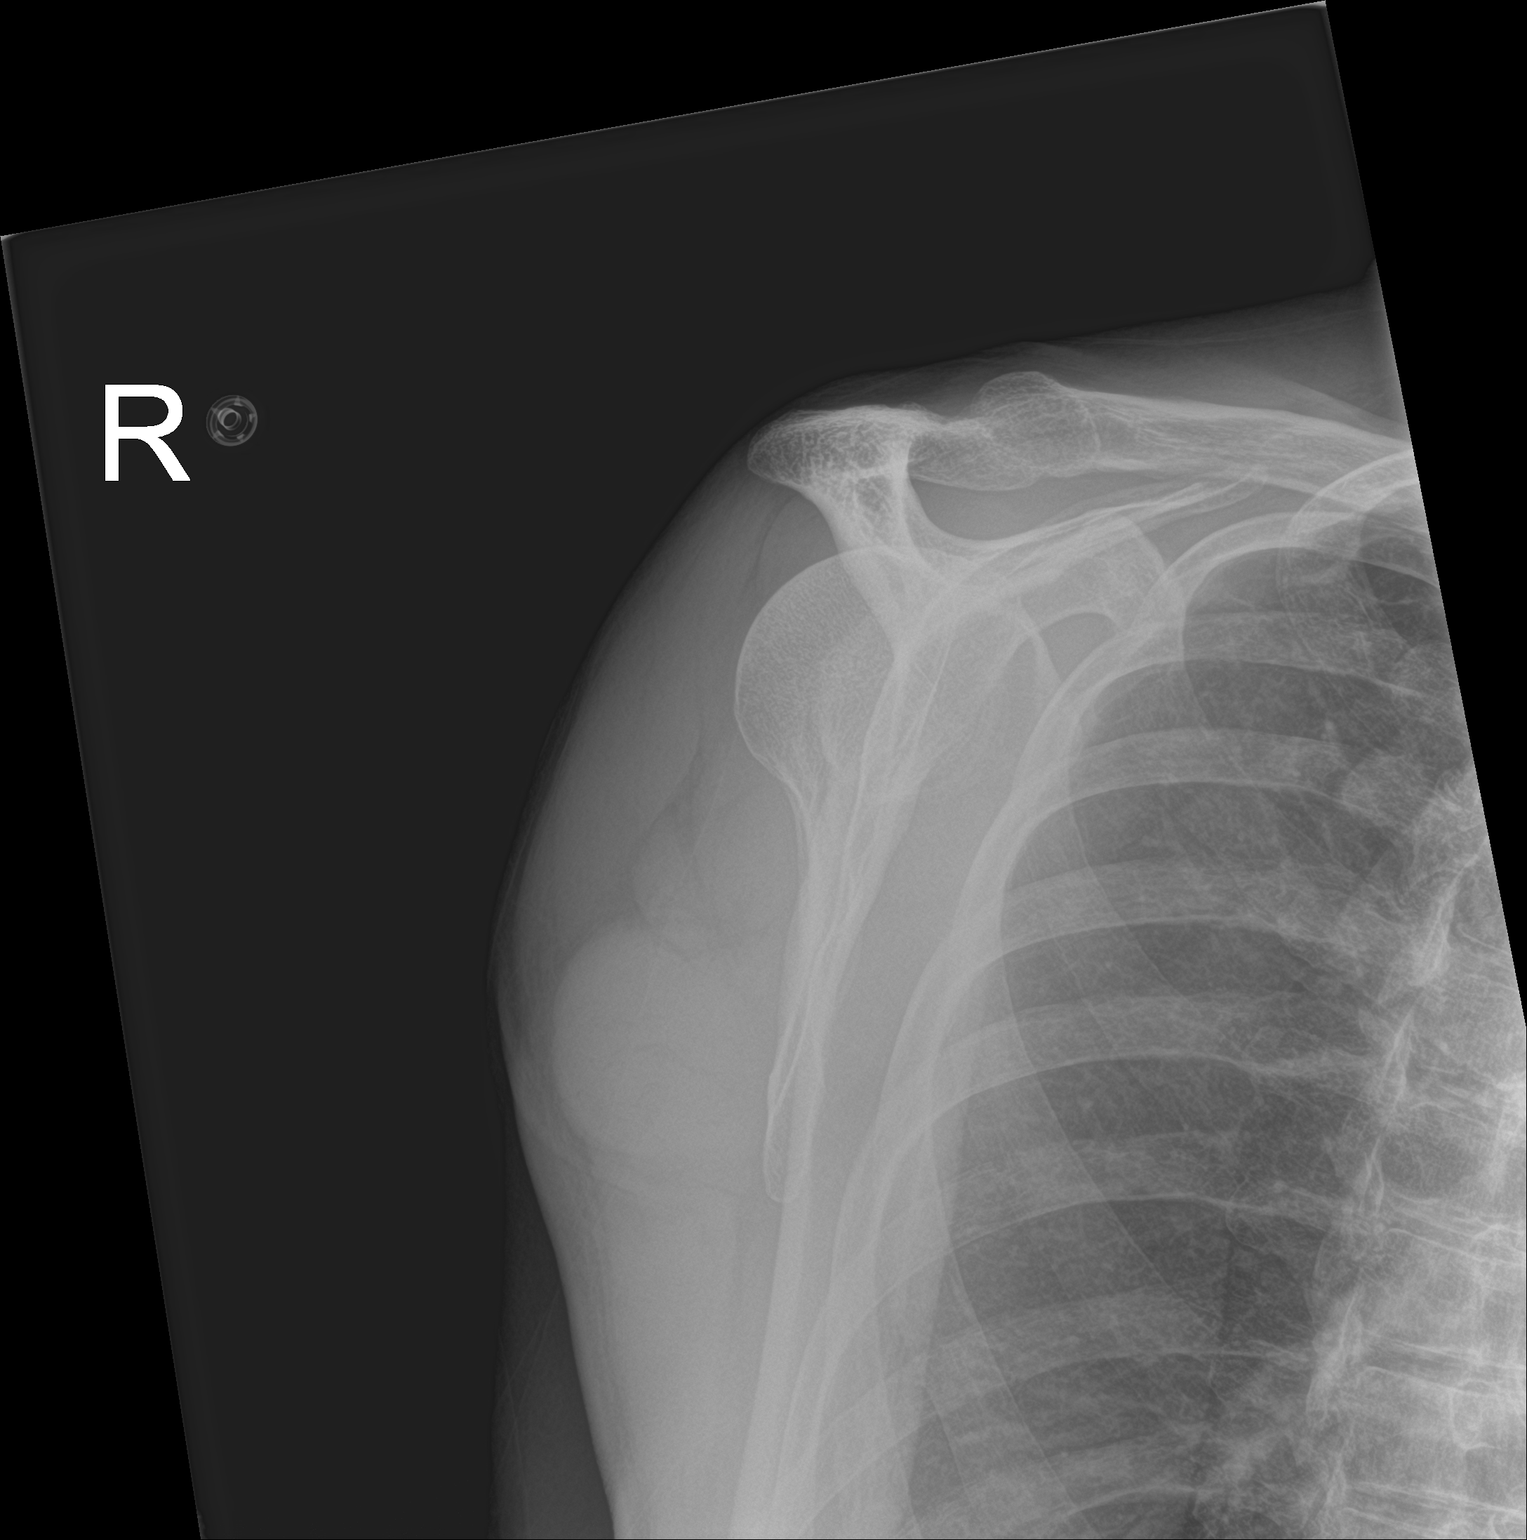

[shoulder ap neutral]
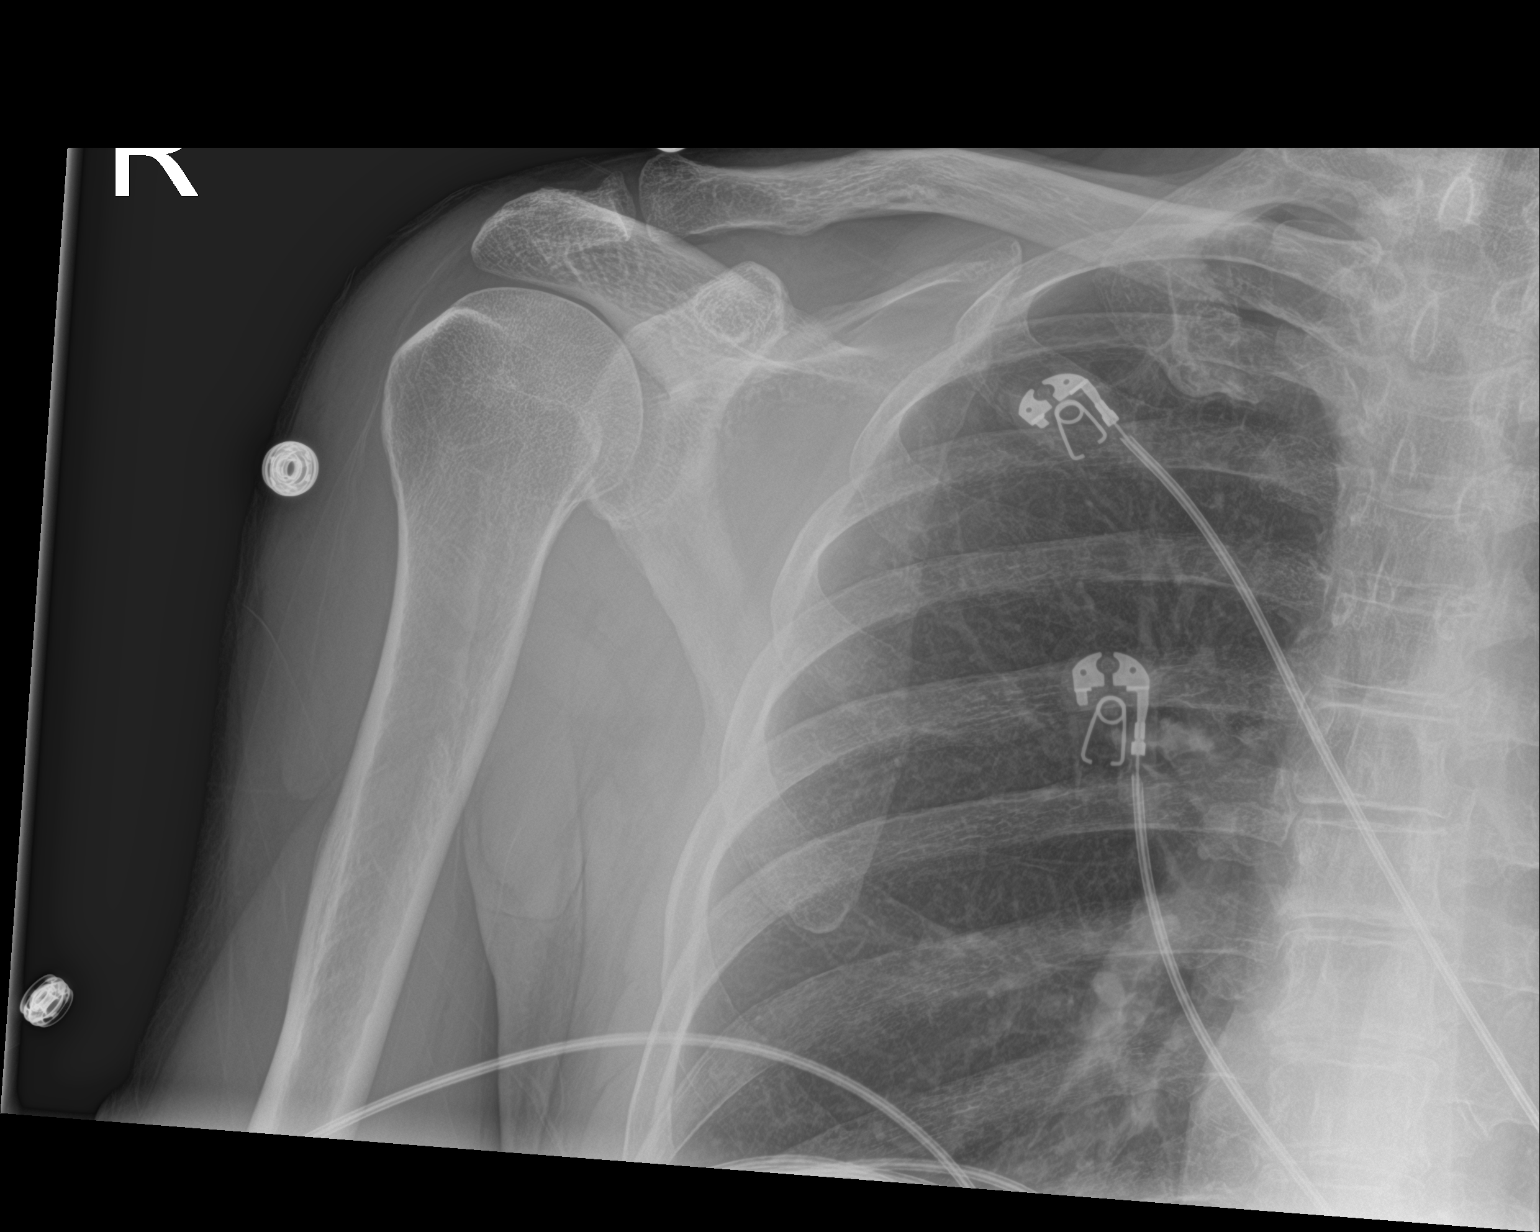

[2 of 2 positions shown; findings below may reference images not displayed]

FINDINGS: There is no evidence of fracture or dislocation. There is no
evidence of arthropathy or other focal bone abnormality. Soft
tissues are unremarkable.
IMPRESSION: Negative.

## 2018-07-31 IMAGING — DX DG CHEST 2V
2 series · 2 of 2 positions shown · non-contrast
Comparison: 04/05/2016

CLINICAL DATA: Chest pain.  Fall

EXAM:
CHEST  2 VIEW

[chest lat]
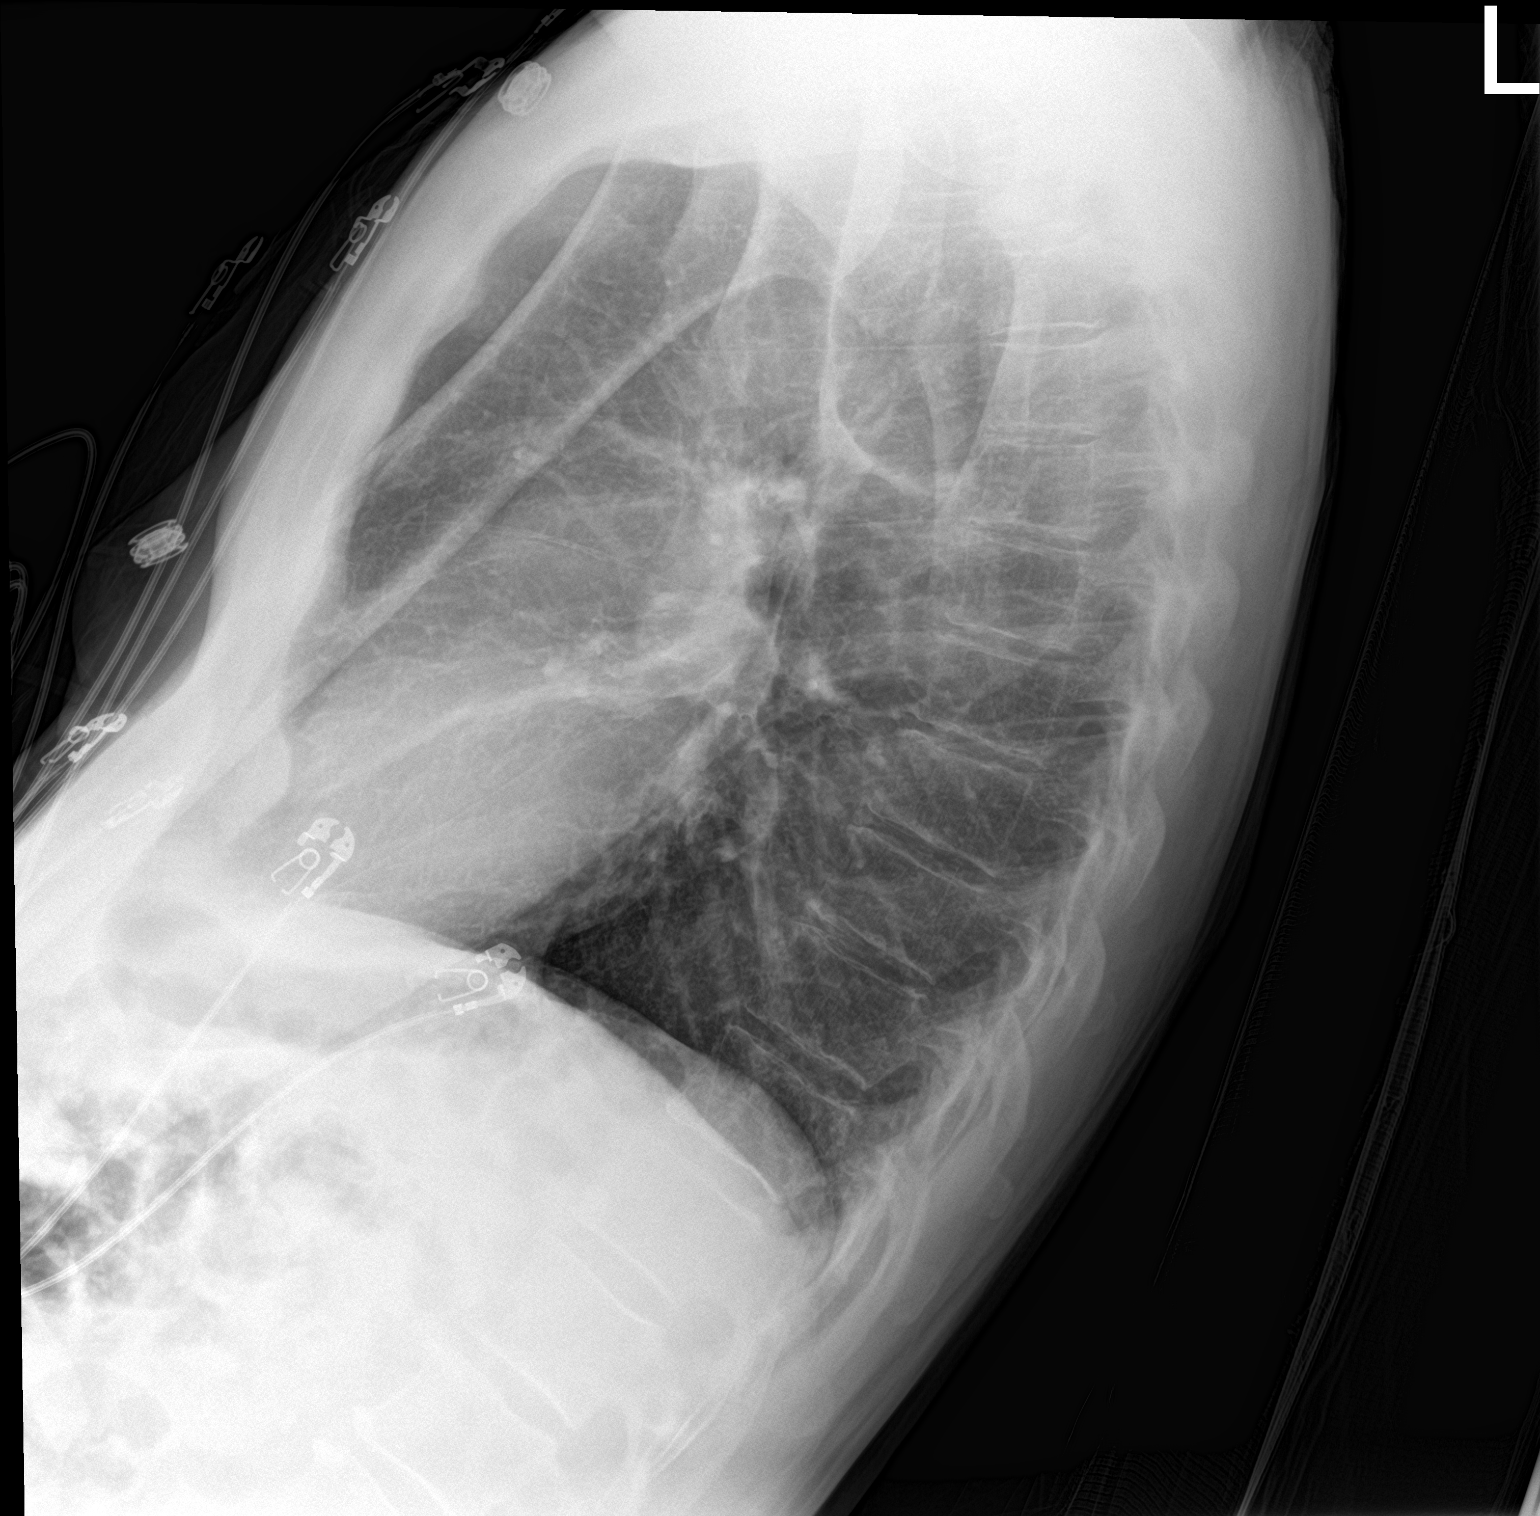

[chest ap]
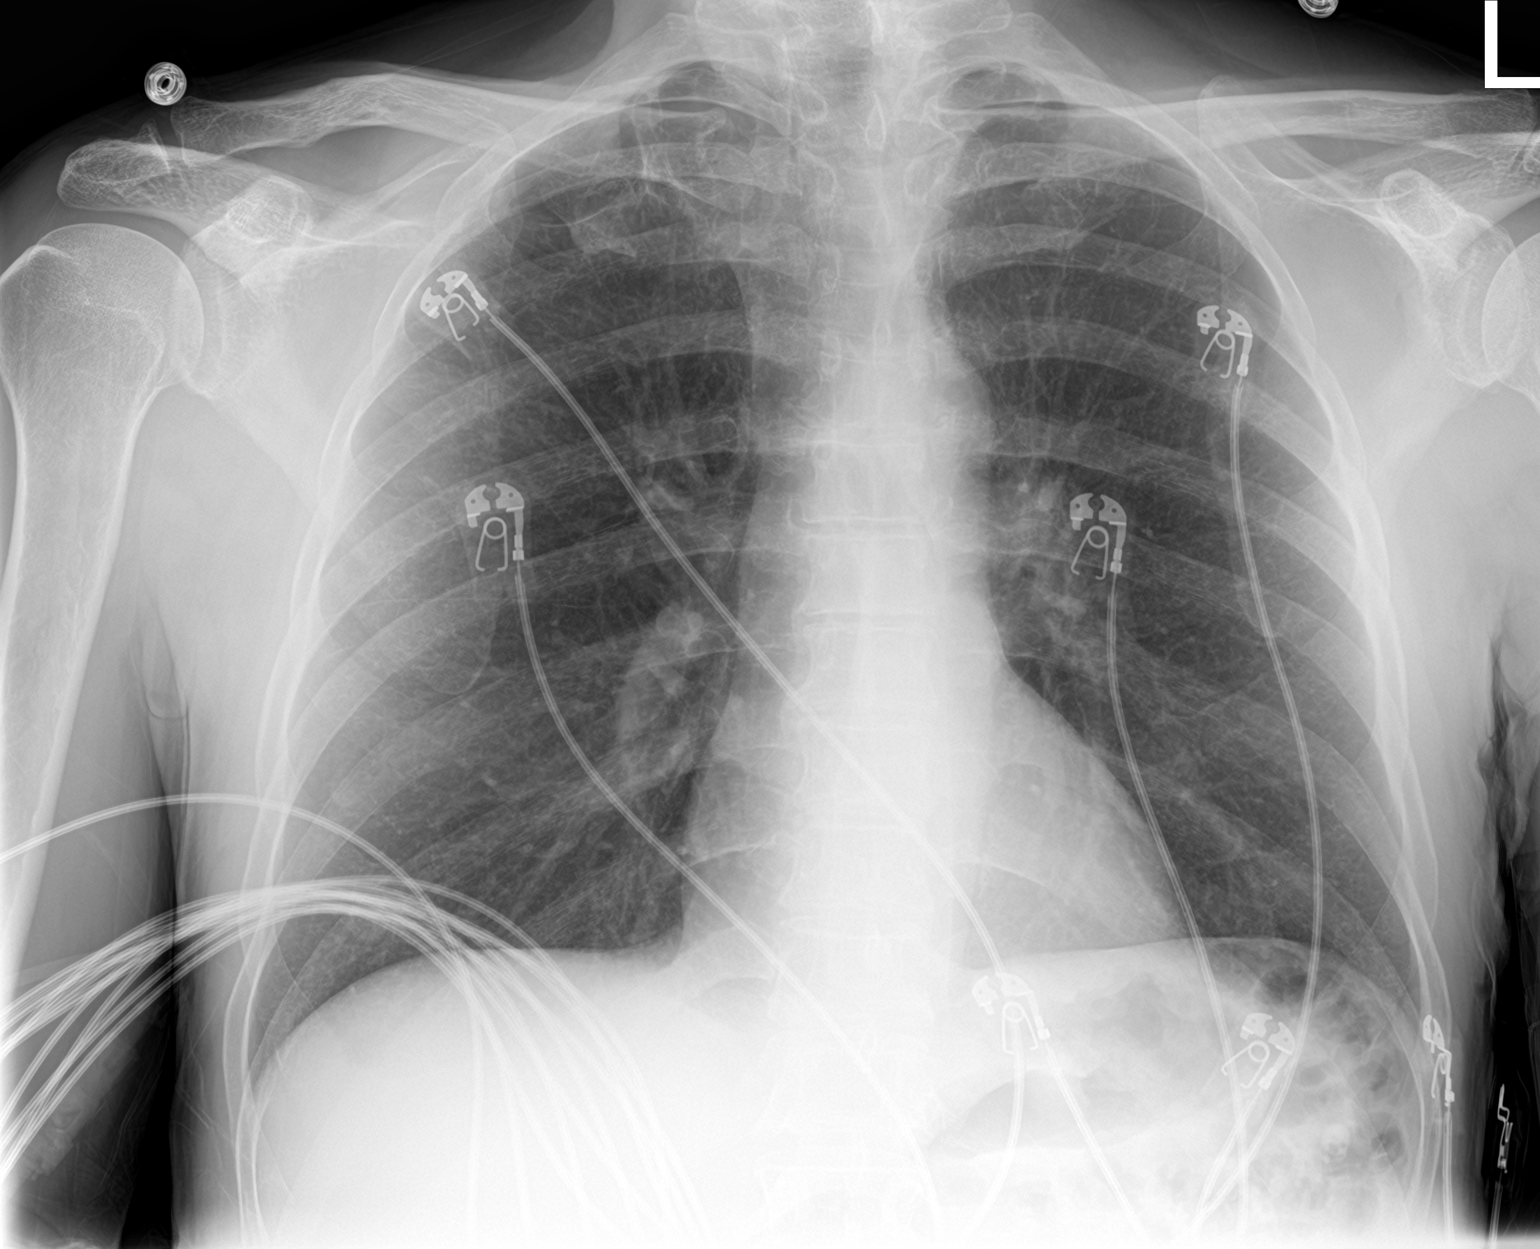

[2 of 2 positions shown; findings below may reference images not displayed]

FINDINGS: Cardiac and mediastinal contours are normal. Apical scarring and
bleb formation bilaterally. Negative for heart failure. Negative for
infiltrate or effusion. No pneumothorax. Negative skeletal
structures.
IMPRESSION: No active cardiopulmonary disease.
